# Patient Record
Sex: Male | Born: 1970 | Race: White | Hispanic: No | Smoking: Current every day smoker
Health system: Southern US, Academic
[De-identification: ages and names within clinical notes are randomized; demographics above are authoritative.]

## PROBLEM LIST (undated history)

## (undated) DIAGNOSIS — F1990 Other psychoactive substance use, unspecified, uncomplicated: Secondary | ICD-10-CM

## (undated) DIAGNOSIS — I10 Essential (primary) hypertension: Secondary | ICD-10-CM

## (undated) DIAGNOSIS — S83249A Other tear of medial meniscus, current injury, unspecified knee, initial encounter: Secondary | ICD-10-CM

## (undated) HISTORY — PX: HX TONSILLECTOMY: SHX27

## (undated) HISTORY — PX: TONSILLECTOMY: SUR1361

## (undated) HISTORY — PX: VASECTOMY: SHX75

## (undated) HISTORY — PX: KNEE ARTHROSCOPY: SUR90

---

## 1998-11-02 ENCOUNTER — Emergency Department (HOSPITAL_COMMUNITY): Admission: EM | Admit: 1998-11-02 | Discharge: 1998-11-02 | Payer: Self-pay | Admitting: Emergency Medicine

## 2009-03-16 ENCOUNTER — Encounter: Admission: RE | Admit: 2009-03-16 | Discharge: 2009-03-16 | Payer: Self-pay | Admitting: Family Medicine

## 2010-10-13 ENCOUNTER — Emergency Department (HOSPITAL_COMMUNITY): Payer: Self-pay | Admitting: Certified Registered"

## 2012-12-22 ENCOUNTER — Other Ambulatory Visit: Payer: Self-pay | Admitting: Family Medicine

## 2012-12-22 ENCOUNTER — Ambulatory Visit
Admission: RE | Admit: 2012-12-22 | Discharge: 2012-12-22 | Disposition: A | Discharge status: Home / Self Care | Payer: 59 | Source: Ambulatory Visit | Attending: Family Medicine | Admitting: Family Medicine

## 2012-12-22 DIAGNOSIS — S0083XA Contusion of other part of head, initial encounter: Secondary | ICD-10-CM

## 2013-11-09 ENCOUNTER — Ambulatory Visit: Payer: 59

## 2014-04-19 ENCOUNTER — Other Ambulatory Visit (HOSPITAL_COMMUNITY): Payer: Self-pay | Admitting: Orthopaedic Surgery

## 2014-05-04 ENCOUNTER — Other Ambulatory Visit (HOSPITAL_COMMUNITY): Payer: Self-pay | Admitting: Orthopaedic Surgery

## 2014-05-04 ENCOUNTER — Encounter (HOSPITAL_COMMUNITY): Payer: Self-pay | Admitting: Pharmacy Technician

## 2014-05-10 ENCOUNTER — Encounter (HOSPITAL_COMMUNITY): Payer: Self-pay

## 2014-05-10 ENCOUNTER — Encounter (HOSPITAL_COMMUNITY)
Admission: RE | Admit: 2014-05-10 | Discharge: 2014-05-10 | Disposition: A | Discharge status: Home / Self Care | Payer: 59 | Source: Ambulatory Visit | Attending: Orthopaedic Surgery | Admitting: Orthopaedic Surgery

## 2014-05-10 DIAGNOSIS — IMO0002 Reserved for concepts with insufficient information to code with codable children: Secondary | ICD-10-CM | POA: Diagnosis present

## 2014-05-10 DIAGNOSIS — X500XXA Overexertion from strenuous movement or load, initial encounter: Secondary | ICD-10-CM | POA: Diagnosis not present

## 2014-05-10 DIAGNOSIS — F17200 Nicotine dependence, unspecified, uncomplicated: Secondary | ICD-10-CM | POA: Diagnosis not present

## 2014-05-10 DIAGNOSIS — Z880 Allergy status to penicillin: Secondary | ICD-10-CM | POA: Diagnosis not present

## 2014-05-10 DIAGNOSIS — Y929 Unspecified place or not applicable: Secondary | ICD-10-CM | POA: Diagnosis not present

## 2014-05-10 HISTORY — DX: Other tear of medial meniscus, current injury, unspecified knee, initial encounter: S83.249A

## 2014-05-10 NOTE — Patient Instructions (Addendum)
Brendan Rodriguez  05/10/2014   Your procedure is scheduled on:  9-25 -2015 Friday  Enter through [facility] Entrance and follow signs to [facility]. Arrive at     1100   AM.  Call this number if you have problems the morning of surgery: [phone]  Or Presurgical Testing [phone].   For Living Will and/or Health Care Power Attorney Forms: please provide copy for your medical record,may bring AM of surgery(Forms should be already notarized -we do not provide this service).(05-10-14 No information preferred today)   Do not eat food/ or drink: After Midnight.  Exception: may have clear liquids:up to 6 Hours before arrival. Nothing after: 0700 AM  Clear liquids include soda, tea, black coffee, apple or grape juice, broth.  Take these medicines the morning of surgery with A SIP OF WATER: NONE.   Do not wear jewelry, make-up or nail polish.  Do not wear lotions, powders, or perfumes. You may not  wear deodorant.  Do not shave 48 hours(2 days) prior to first CHG shower(legs and under arms).(Shaving face and neck okay.)  Do not bring valuables to the hospital.(Hospital is not responsible for lost valuables).  Contacts, dentures or removable bridgework, body piercing, hair pins may not be worn into surgery.  Leave suitcase in the car. After surgery it may be brought to your room.  For patients admitted to the hospital, checkout time is 11:00 AM the day of discharge.(Restricted visitors-Any Persons displaying flu-like symptoms or illness).    Patients discharged the day of surgery will not be allowed to drive home. Must have responsible person with you x 24 hours once discharged.  Name and phone number of your driver: Brendan Rodriguez, spouse [phone] cell  Special Instructions: CHG(Chlorhedine 4%-"Hibiclens","Betasept","Aplicare") Shower Use Special Wash: see special instructions.(avoid face and genitals)   _________________________    [facility] - Preparing for  Surgery Before surgery, you can play an important role.  Because skin is not sterile, your skin needs to be as free of germs as possible.  You can reduce the number of germs on your skin by washing with CHG (chlorahexidine gluconate) soap before surgery.  CHG is an antiseptic cleaner which kills germs and bonds with the skin to continue killing germs even after washing. Please DO NOT use if you have an allergy to CHG or antibacterial soaps.  If your skin becomes reddened/irritated stop using the CHG and inform your nurse when you arrive at Short Stay. Do not shave (including legs and underarms) for at least 48 hours prior to the first CHG shower.  You may shave your face/neck. Please follow these instructions carefully:  1.  Shower with CHG Soap the night before surgery and the  morning of Surgery.  2.  If you choose to wash your hair, wash your hair first as usual with your  normal  shampoo.  3.  After you shampoo, rinse your hair and body thoroughly to remove the  shampoo.                           4.  Use CHG as you would any other liquid soap.  You can apply chg directly  to the skin and wash                       Gently with a scrungie or clean washcloth.  5.  Apply the CHG Soap to your body  ONLY FROM THE NECK DOWN.   Do not use on face/ open                           Wound or open sores. Avoid contact with eyes, ears mouth and genitals (private parts).                       Wash face,  Genitals (private parts) with your normal soap.             6.  Wash thoroughly, paying special attention to the area where your surgery  will be performed.  7.  Thoroughly rinse your body with warm water from the neck down.  8.  DO NOT shower/wash with your normal soap after using and rinsing off  the CHG Soap.                9.  Pat yourself dry with a clean towel.            10.  Wear clean pajamas.            11.  Place clean sheets on your bed the night of your first shower and do not  sleep with pets. Day  of Surgery : Do not apply any lotions/deodorants the morning of surgery.  Please wear clean clothes to the hospital/surgery center.  FAILURE TO FOLLOW THESE INSTRUCTIONS MAY RESULT IN THE CANCELLATION OF YOUR SURGERY PATIENT SIGNATURE_________________________________  NURSE SIGNATURE__________________________________  ________________________________________________________________________

## 2014-05-13 ENCOUNTER — Encounter (HOSPITAL_COMMUNITY)
Admission: RE | Disposition: A | Discharge status: Home / Self Care | Payer: Self-pay | Source: Ambulatory Visit | Attending: Orthopaedic Surgery

## 2014-05-13 ENCOUNTER — Encounter (HOSPITAL_COMMUNITY): Payer: 59 | Admitting: Certified Registered Nurse Anesthetist

## 2014-05-13 ENCOUNTER — Ambulatory Visit (HOSPITAL_COMMUNITY)
Admission: RE | Admit: 2014-05-13 | Discharge: 2014-05-13 | Disposition: A | Discharge status: Home / Self Care | Payer: 59 | Source: Ambulatory Visit | Attending: Orthopaedic Surgery | Admitting: Orthopaedic Surgery

## 2014-05-13 ENCOUNTER — Ambulatory Visit (HOSPITAL_COMMUNITY): Payer: 59 | Admitting: Certified Registered Nurse Anesthetist

## 2014-05-13 ENCOUNTER — Encounter (HOSPITAL_COMMUNITY): Payer: Self-pay

## 2014-05-13 DIAGNOSIS — IMO0002 Reserved for concepts with insufficient information to code with codable children: Secondary | ICD-10-CM | POA: Diagnosis not present

## 2014-05-13 DIAGNOSIS — X500XXA Overexertion from strenuous movement or load, initial encounter: Secondary | ICD-10-CM | POA: Insufficient documentation

## 2014-05-13 DIAGNOSIS — S83207A Unspecified tear of unspecified meniscus, current injury, left knee, initial encounter: Secondary | ICD-10-CM

## 2014-05-13 DIAGNOSIS — Y929 Unspecified place or not applicable: Secondary | ICD-10-CM | POA: Insufficient documentation

## 2014-05-13 DIAGNOSIS — F17200 Nicotine dependence, unspecified, uncomplicated: Secondary | ICD-10-CM | POA: Insufficient documentation

## 2014-05-13 DIAGNOSIS — Z880 Allergy status to penicillin: Secondary | ICD-10-CM | POA: Insufficient documentation

## 2014-05-13 HISTORY — PX: KNEE ARTHROSCOPY: SHX127

## 2014-05-13 SURGERY — ARTHROSCOPY, KNEE
Anesthesia: General | Site: Knee | Laterality: Left

## 2014-05-13 MED ORDER — CLINDAMYCIN PHOSPHATE 900 MG/50ML IV SOLN
900.0000 mg | INTRAVENOUS | Status: AC
  Administered 2014-05-13: 900 mg via INTRAVENOUS

## 2014-05-13 MED ORDER — HYDROMORPHONE HCL 1 MG/ML IJ SOLN
INTRAMUSCULAR | Status: DC | PRN
  Administered 2014-05-13: 0.5 mg via INTRAVENOUS
  Administered 2014-05-13: 1 mg via INTRAVENOUS
  Administered 2014-05-13: 0.5 mg via INTRAVENOUS

## 2014-05-13 MED ORDER — MORPHINE SULFATE 4 MG/ML IJ SOLN
INTRAMUSCULAR | Status: DC | PRN
  Administered 2014-05-13: 4 mg via INTRAVENOUS

## 2014-05-13 MED ORDER — ONDANSETRON HCL 4 MG/2ML IJ SOLN
INTRAMUSCULAR | Status: AC
  Filled 2014-05-13: qty 2

## 2014-05-13 MED ORDER — LIDOCAINE HCL (CARDIAC) 20 MG/ML IV SOLN
INTRAVENOUS | Status: AC
  Filled 2014-05-13: qty 5

## 2014-05-13 MED ORDER — FENTANYL CITRATE 0.05 MG/ML IJ SOLN
INTRAMUSCULAR | Status: AC
  Filled 2014-05-13: qty 2

## 2014-05-13 MED ORDER — SODIUM CHLORIDE 0.9 % IV SOLN
INTRAVENOUS | Status: DC | PRN
  Administered 2014-05-13: 14:00:00 via INTRAVENOUS

## 2014-05-13 MED ORDER — HYDROMORPHONE HCL 2 MG/ML IJ SOLN
INTRAMUSCULAR | Status: AC
  Filled 2014-05-13: qty 1

## 2014-05-13 MED ORDER — BUPIVACAINE HCL (PF) 0.5 % IJ SOLN
INTRAMUSCULAR | Status: AC
  Filled 2014-05-13: qty 30

## 2014-05-13 MED ORDER — ONDANSETRON HCL 4 MG/2ML IJ SOLN
INTRAMUSCULAR | Status: DC | PRN
  Administered 2014-05-13: 4 mg via INTRAVENOUS

## 2014-05-13 MED ORDER — MIDAZOLAM HCL 5 MG/5ML IJ SOLN
INTRAMUSCULAR | Status: DC | PRN
  Administered 2014-05-13: 2 mg via INTRAVENOUS

## 2014-05-13 MED ORDER — MORPHINE SULFATE 4 MG/ML IJ SOLN
INTRAMUSCULAR | Status: AC
  Filled 2014-05-13: qty 1

## 2014-05-13 MED ORDER — MEPERIDINE HCL 50 MG/ML IJ SOLN: 6.2500 mg | INTRAMUSCULAR | Status: DC | PRN

## 2014-05-13 MED ORDER — BUPIVACAINE HCL (PF) 0.5 % IJ SOLN
INTRAMUSCULAR | Status: DC | PRN
  Administered 2014-05-13: 30 mL

## 2014-05-13 MED ORDER — MIDAZOLAM HCL 2 MG/2ML IJ SOLN
INTRAMUSCULAR | Status: AC
  Filled 2014-05-13: qty 2

## 2014-05-13 MED ORDER — FENTANYL CITRATE 0.05 MG/ML IJ SOLN
INTRAMUSCULAR | Status: DC | PRN
  Administered 2014-05-13: 100 ug via INTRAVENOUS

## 2014-05-13 MED ORDER — LACTATED RINGERS IR SOLN
Status: DC | PRN
  Administered 2014-05-13: 2

## 2014-05-13 MED ORDER — FENTANYL CITRATE 0.05 MG/ML IJ SOLN: 25.0000 ug | INTRAMUSCULAR | Status: DC | PRN

## 2014-05-13 MED ORDER — HYDROCODONE-ACETAMINOPHEN 5-325 MG PO TABS: 1.0000 | ORAL_TABLET | ORAL | Status: AC | PRN

## 2014-05-13 MED ORDER — PROMETHAZINE HCL 25 MG/ML IJ SOLN: 6.2500 mg | INTRAMUSCULAR | Status: DC | PRN

## 2014-05-13 MED ORDER — HYDROCODONE-ACETAMINOPHEN 5-325 MG PO TABS
1.0000 | ORAL_TABLET | Freq: Once | ORAL | Status: AC
  Administered 2014-05-13: 2 via ORAL
  Filled 2014-05-13: qty 2

## 2014-05-13 MED ORDER — LACTATED RINGERS IV SOLN
INTRAVENOUS | Status: DC
  Administered 2014-05-13: 16:00:00 via INTRAVENOUS

## 2014-05-13 MED ORDER — LIDOCAINE HCL (CARDIAC) 20 MG/ML IV SOLN
INTRAVENOUS | Status: DC | PRN
  Administered 2014-05-13: 50 mg via INTRAVENOUS

## 2014-05-13 MED ORDER — PROPOFOL 10 MG/ML IV BOLUS
INTRAVENOUS | Status: DC | PRN
  Administered 2014-05-13: 180 mg via INTRAVENOUS

## 2014-05-13 MED ORDER — CLINDAMYCIN PHOSPHATE 900 MG/50ML IV SOLN
INTRAVENOUS | Status: AC
  Filled 2014-05-13: qty 50

## 2014-05-13 MED ORDER — LACTATED RINGERS IV SOLN
INTRAVENOUS | Status: DC
  Administered 2014-05-13: 1000 mL via INTRAVENOUS

## 2014-05-13 MED ORDER — PROPOFOL 10 MG/ML IV BOLUS
INTRAVENOUS | Status: AC
  Filled 2014-05-13: qty 20

## 2014-05-13 MED ORDER — SODIUM CHLORIDE 0.9 % IV SOLN: INTRAVENOUS | Status: DC

## 2014-05-13 SURGICAL SUPPLY — 28 items
BLADE CUDA SHAVER 3.5 (BLADE) ×2 IMPLANT
COUNTER NEEDLE 20 DBL MAG RED (NEEDLE) ×2 IMPLANT
CUFF TOURN SGL QUICK 34 (TOURNIQUET CUFF) ×2
CUFF TRNQT CYL 34X4X40X1 (TOURNIQUET CUFF) ×1 IMPLANT
DRAPE U-SHAPE 47X51 STRL (DRAPES) ×2 IMPLANT
DRSG EMULSION OIL 3X3 NADH (GAUZE/BANDAGES/DRESSINGS) ×1 IMPLANT
DURAPREP 26ML APPLICATOR (WOUND CARE) ×2 IMPLANT
GAUZE XEROFORM 1X8 LF (GAUZE/BANDAGES/DRESSINGS) ×2 IMPLANT
GLOVE BIO SURGEON STRL SZ7.5 (GLOVE) ×2 IMPLANT
GLOVE BIOGEL PI IND STRL 8 (GLOVE) ×1 IMPLANT
GLOVE BIOGEL PI INDICATOR 8 (GLOVE) ×1
GOWN STRL REUS W/TWL XL LVL3 (GOWN DISPOSABLE) ×2 IMPLANT
IV LACTATED RINGER IRRG 3000ML (IV SOLUTION) ×4
IV LR IRRIG 3000ML ARTHROMATIC (IV SOLUTION) ×2 IMPLANT
KIT BASIN OR (CUSTOM PROCEDURE TRAY) ×2 IMPLANT
MANIFOLD NEPTUNE II (INSTRUMENTS) ×2 IMPLANT
PACK ARTHROSCOPY WL (CUSTOM PROCEDURE TRAY) ×2 IMPLANT
PAD ABD 8X10 STRL (GAUZE/BANDAGES/DRESSINGS) ×1 IMPLANT
PADDING CAST COTTON 6X4 STRL (CAST SUPPLIES) ×2 IMPLANT
SET ARTHROSCOPY TUBING (MISCELLANEOUS) ×2
SET ARTHROSCOPY TUBING LN (MISCELLANEOUS) ×1 IMPLANT
SUT ETHILON 4 0 PS 2 18 (SUTURE) ×2 IMPLANT
SYR 20CC LL (SYRINGE) ×2 IMPLANT
SYR CONTROL 10ML LL (SYRINGE) ×2 IMPLANT
TOWEL OR 17X26 10 PK STRL BLUE (TOWEL DISPOSABLE) ×2 IMPLANT
TUBING CONNECTING 10 (TUBING) IMPLANT
WAND 90 DEG TURBOVAC W/CORD (SURGICAL WAND) IMPLANT
WRAP KNEE MAXI GEL POST OP (GAUZE/BANDAGES/DRESSINGS) ×2 IMPLANT

## 2014-05-13 NOTE — Brief Op Note (Signed)
05/13/2014  2:39 PM  PATIENT:  Brendan Rodriguez  43 y.o. male  PRE-OPERATIVE DIAGNOSIS:  Left knee medial meniscal tear  POST-OPERATIVE DIAGNOSIS:  Left knee medial meniscal tear  PROCEDURE:  Procedure(s): Left knee arthroscopy with partial medial meniscectomy (Left)  SURGEON:  Surgeon(s) and Role:    * Kathryne Hitch, MD - Primary  PHYSICIAN ASSISTANT: Rexene Edison, PA-C  ANESTHESIA:   local and general  EBL:  Total I/O In: 1000 [I.V.:1000] Out: -   BLOOD ADMINISTERED:none  DRAINS: none   LOCAL MEDICATIONS USED:  MARCAINE     SPECIMEN:  No Specimen  DISPOSITION OF SPECIMEN:  N/A  COUNTS:  YES  TOURNIQUET:  * No tourniquets in log *  DICTATION: .Other Dictation: Dictation Number [id number]  PLAN OF CARE: Discharge to home after PACU  PATIENT DISPOSITION:  PACU - hemodynamically stable.   Delay start of Pharmacological VTE agent (>24hrs) due to surgical blood loss or risk of bleeding: not applicable

## 2014-05-13 NOTE — Anesthesia Preprocedure Evaluation (Addendum)
Anesthesia Evaluation  Patient identified by MRN, date of birth, ID band Patient awake    Reviewed: Allergy & Precautions, H&P , NPO status , Patient's Chart, lab work & pertinent test results  Airway Mallampati: II TM Distance: >3 FB Neck ROM: Full    Dental no notable dental hx.    Pulmonary neg pulmonary ROS, Current Smoker,  breath sounds clear to auscultation  Pulmonary exam normal       Cardiovascular negative cardio ROS  Rhythm:Regular Rate:Normal     Neuro/Psych negative neurological ROS  negative psych ROS   GI/Hepatic negative GI ROS, Neg liver ROS,   Endo/Other  negative endocrine ROS  Renal/GU negative Renal ROS  negative genitourinary   Musculoskeletal negative musculoskeletal ROS (+)   Abdominal   Peds negative pediatric ROS (+)  Hematology negative hematology ROS (+)   Anesthesia Other Findings   Reproductive/Obstetrics negative OB ROS                           Anesthesia Physical Anesthesia Plan  ASA: II  Anesthesia Plan: General   Post-op Pain Management:    Induction: Intravenous  Airway Management Planned: LMA  Additional Equipment:   Intra-op Plan:   Post-operative Plan: Extubation in OR  Informed Consent: I have reviewed the patients History and Physical, chart, labs and discussed the procedure including the risks, benefits and alternatives for the proposed anesthesia with the patient or authorized representative who has indicated his/her understanding and acceptance.   Dental advisory given  Plan Discussed with: CRNA  Anesthesia Plan Comments:         Anesthesia Quick Evaluation  

## 2014-05-13 NOTE — Anesthesia Postprocedure Evaluation (Signed)
  Anesthesia Post-op Note  Patient: Brendan Rodriguez  Procedure(s) Performed: Procedure(s) (LRB): Left knee arthroscopy with partial medial meniscectomy (Left)  Patient Location: PACU  Anesthesia Type: General  Level of Consciousness: awake and alert   Airway and Oxygen Therapy: Patient Spontanous Breathing  Post-op Pain: mild  Post-op Assessment: Post-op Vital signs reviewed, Patient's Cardiovascular Status Stable, Respiratory Function Stable, Patent Airway and No signs of Nausea or vomiting  Last Vitals:  Filed Vitals:   05/13/14 1500  BP: 134/77  Pulse: 60  Temp:   Resp: 17    Post-op Vital Signs: stable   Complications: No apparent anesthesia complications

## 2014-05-13 NOTE — H&P (Signed)
Brendan Rodriguez is an 43 y.o. male.   Chief Complaint: left knee pain with locking and catching HPI: 43 yo male with an acute injury to his left knee.  With locking, catching and swelling, a MRI was obtained that showed a large horizontal bucket handle tear of the medial meniscus.  Surgery has been recommended due to his mechanical symptoms.  He understands fully the risks and benefits of surgery.  Past Medical History  Diagnosis Date  . Torn medial meniscus     left knee    Past Surgical History  Procedure Laterality Date  . Knee arthroscopy Right     '95 torned menicus  . Tonsillectomy    . Vasectomy      No family history on file. Social History:  reports that he has been smoking.  He does not have any smokeless tobacco history on file. He reports that he drinks alcohol. He reports that he does not use illicit drugs.  Allergies:  Allergies  Allergen Reactions  . Penicillins     Light headed. Fainted.     No prescriptions prior to admission    No results found for this or any previous visit (from the past 48 hour(s)). No results found.  Review of Systems  All other systems reviewed and are negative.   There were no vitals taken for this visit. Physical Exam  Constitutional: He is oriented to person, place, and time. He appears well-developed and well-nourished.  HENT:  Head: Normocephalic and atraumatic.  Eyes: EOM are normal. Pupils are equal, round, and reactive to light.  Neck: Normal range of motion. Neck supple.  Cardiovascular: Normal rate and regular rhythm.   Respiratory: Effort normal and breath sounds normal.  GI: Soft. Bowel sounds are normal.  Musculoskeletal:       Left knee: He exhibits effusion and abnormal meniscus. Tenderness found. Lateral joint line tenderness noted.  Neurological: He is alert and oriented to person, place, and time.  Skin: Skin is warm and dry.  Psychiatric: He has a normal mood and affect.     Assessment/Plan Left knee  acute meniscal tear 1)  To the OR today for a left knee arthroscopy with a partial meniscectomy.  BLACKMAN,CHRISTOPHER Y 05/13/2014, 7:30 AM

## 2014-05-13 NOTE — Transfer of Care (Signed)
Immediate Anesthesia Transfer of Care Note  Patient: Brendan Rodriguez  Procedure(s) Performed: Procedure(s) (LRB): Left knee arthroscopy with partial medial meniscectomy (Left)  Patient Location: PACU  Anesthesia Type: General  Level of Consciousness: sedated, patient cooperative and responds to stimulation  Airway & Oxygen Therapy: Patient Spontanous Breathing and Patient connected to face mask oxgen  Post-op Assessment: Report given to PACU RN and Post -op Vital signs reviewed and stable  Post vital signs: Reviewed and stable  Complications: No apparent anesthesia complications

## 2014-05-13 NOTE — Discharge Instructions (Signed)
Increase your activities as comfort allows. Ice and elevation as needed. You may put full weight on your leg. You can remove all of your dressings tomorrow and start getting your incisions wet daily in the shower. Place band-aids daily over your incisions to protect the sutures. Do take a full-strength 325 mg aspirin daily for the next 7 days.

## 2014-05-14 NOTE — Op Note (Signed)
Brendan Rodriguez, Brendan Rodriguez NO.:  [id number]  MEDICAL RECORD NO.:  [id number]  LOCATION:  [facility]                         FACILITY:  [facility]  PHYSICIAN:  Vanita Panda. Magnus Ivan, M.D.DATE OF BIRTH:  [birth year]  DATE OF PROCEDURE:  05/13/2014 DATE OF DISCHARGE:  05/13/2014                              OPERATIVE REPORT   PREOPERATIVE DIAGNOSIS:  Left knee with acute large buckle-handle tear of the medial meniscus.  POSTOPERATIVE DIAGNOSIS:  Left knee with acute large buckle-handle tear of the medial meniscus.  FINDINGS:  Acute large bucket-handle medial meniscal tear, left knee.  PROCEDURE:  Left knee arthroscopy with partial medial meniscectomy.  SURGEON:  Vanita Panda. Magnus Ivan, M.D.  ASSISTANT:  Richardean Canal, PA-C.  ANESTHESIA: 1. General. 2. Local.  BLOOD LOSS:  Minimal.  COMPLICATIONS:  None.  INDICATIONS:  Brendan Rodriguez is a 43 year old gentleman who in early August did some type of minimal activity with landing on his knee awkwardly and felt a pop in his knee.  He had a medial locking, catching and inability to straighten his knee.  An MRI was eventually obtained, which showed a large bucket-handle tear of the medial meniscus.  We talked to him about his options including a partial medial meniscectomy versus a meniscal repair.  He understands risks of both of these as well as benefits, and he did wish to proceed with a partial medial meniscectomy.  The risks and benefits of surgery were explained to him again in great detail and he did wish to proceed.  PROCEDURE DESCRIPTION:  After informed consent was obtained, appropriate left knee was marked.  He was brought to the operating room, placed supine on the operating room table.  General anesthesia was then obtained.  His left leg was prepped and draped from the thigh down the ankle with DuraPrep and sterile drapes including sterile stockinette with a bed raised and a lateral leg was utilized.  A time-out was  called to identify correct patient, correct left knee.  I then made an anterolateral arthroscopy portal.  I inserted a cannula into the knee and found a large effusion.  We went to the medial compartment and made an anteromedial incision and right away found a large bucket-handle medial meniscal tear.  This went from the mid body all the way back to the posterior horn.  Using up cutting biters and arthroscopic shaver, we removed a large bucket-handle tear.  We then probed the remaining parts of the meniscus and found them to be intact with still some meniscal rim posteriorly intact.  We then prepped the ACL and PCL and found those to be intact or the lateral meniscus looked good, as well as the cartilage throughout the knee.  The patellofemoral joint was also normal.  We then allowed fluid lavage of the knee and drained off fluid from the knee and tried to remove as much remnant debris as we could.  We then closed the portal sites with interrupted nylon suture __________ a mixture of morphine and Marcaine into the knee.  A well-padded sterile dressing was applied.  He was awakened and extubated and taken to recovery room in stable condition.  All final counts were correct.  There were no complications noted.     Vanita Panda. Magnus Ivan, M.D.     CYB/MEDQ  D:  05/13/2014  T:  05/14/2014  Job:  [id number]

## 2014-05-16 ENCOUNTER — Encounter (HOSPITAL_COMMUNITY): Payer: Self-pay | Admitting: Orthopaedic Surgery

## 2016-08-08 ENCOUNTER — Ambulatory Visit (INDEPENDENT_AMBULATORY_CARE_PROVIDER_SITE_OTHER): Payer: Self-pay | Admitting: Physician Assistant

## 2016-08-08 DIAGNOSIS — M7021 Olecranon bursitis, right elbow: Secondary | ICD-10-CM

## 2016-08-08 NOTE — Progress Notes (Signed)
   Office Visit Note   Patient: Brendan Rodriguez           Date of Birth: 1971/06/14           MRN: [id number] Visit Date: 08/08/2016              Requested by: No referring provider defined for this encounter. PCP: No primary care provider on file.   Assessment & Plan: Visit Diagnoses:  1. Olecranon bursitis, right elbow     Plan: Compressive bandage is applied and leave this on until tonight. Advised him to not rest his elbow on anything Morrie Sheldonshley whenever he is to sitting around like his desk at work  Follow-Up Instructions: Return if symptoms worsen or fail to improve.   Orders:  Orders Placed This Encounter  Procedures  . Hand/Upper Extremity Injection/Arthrocentesis   No orders of the defined types were placed in this encounter.     Procedures: Hand/UE Inj Date/Time: 08/08/2016 3:41 PM Performed by: Kirtland BouchardLARK, Emmelia Holdsworth W Authorized by: Kirtland BouchardLARK, Rozann Holts W   Consent Given by:  Patient Indications:  Pain Condition comment:  Olecranon bursa Needle Size:  22 G Approach:  Dorsal Aspirate amount (mL):  10 Aspirate:  Blood-tinged Patient tolerance:  Patient tolerated the procedure well with no immediate complications     Clinical Data: No additional findings.   Subjective: Chief Complaint  Patient presents with  . Right Elbow - Pain    Swelling on elbow x a few weeks.    Brendan Rodriguez is well known to Dr. Eliberto IvoryBlackman's service comes in with a new complaint of right elbow swelling and pain. No known injury area. States that he rest his elbow on his desk throughout the day. Elbow swelling has been there for about a week and a half no fevers or chills    Review of Systems   Objective: Vital Signs: There were no vitals taken for this visit.  Physical Exam  Constitutional: He is oriented to person, place, and time. He appears well-developed and well-nourished. No distress.  Neurological: He is alert and oriented to person, place, and time.  Psychiatric: He has a normal  mood and affect.    Ortho Exam Right elbow: Obvious olecranon bursitis is no abnormal warmth the swelling and slight erythema. Overall good range of motion of the elbow without pain. Hand sensation intact. Right radial pulses intact. Specialty Comments:  No specialty comments available.  Imaging: No results found.   PMFS History: Patient Active Problem List   Diagnosis Date Noted  . Acute meniscal tear of left knee 05/13/2014   Past Medical History:  Diagnosis Date  . Torn medial meniscus    left knee    No family history on file.  Past Surgical History:  Procedure Laterality Date  . KNEE ARTHROSCOPY Right    '95 torned menicus  . KNEE ARTHROSCOPY Left 05/13/2014   Procedure: Left knee arthroscopy with partial medial meniscectomy;  Surgeon: Kathryne Hitchhristopher Y Blackman, MD;  Location: [facility];  Service: Orthopedics;  Laterality: Left;  . TONSILLECTOMY    . VASECTOMY     Social History   Occupational History  . Not on file.   Social History Main Topics  . Smoking status: Current Every Day Smoker    Packs/day: 1.00    Years: 20.00  . Smokeless tobacco: Not on file  . Alcohol use Yes     Comment: weekends  . Drug use: No  . Sexual activity: Yes

## 2016-09-19 ENCOUNTER — Ambulatory Visit (INDEPENDENT_AMBULATORY_CARE_PROVIDER_SITE_OTHER): Payer: Commercial Managed Care - HMO | Admitting: Physician Assistant

## 2016-09-19 DIAGNOSIS — M7021 Olecranon bursitis, right elbow: Secondary | ICD-10-CM

## 2016-09-19 MED ORDER — METHYLPREDNISOLONE ACETATE 40 MG/ML IJ SUSP
40.0000 mg | INTRAMUSCULAR | Status: AC | PRN
  Administered 2016-09-19: 40 mg

## 2016-09-19 MED ORDER — LIDOCAINE HCL 1 % IJ SOLN
3.0000 mL | INTRAMUSCULAR | Status: AC | PRN
  Administered 2016-09-19: 3 mL

## 2016-09-19 NOTE — Progress Notes (Signed)
   Office Visit Note   Patient: Brendan Rodriguez           Date of Birth: [birth year]           MRN: [id number] Visit Date: 09/19/2016              Requested by: No referring provider defined for this encounter. PCP: Johny BlamerHARRIS, WILLIAM, MD   Assessment & Plan: Visit Diagnoses:  1. Olecranon bursitis, right elbow     Plan: The elbow was wrapped with a compression dressing he'll leave this on until this evening and remove it. Did discuss with him if this reoccurs would recommend right elbow olecranon bursectomy. He'll follow with us on a when necessary basis.  Follow-Up Instructions: Return if symptoms worsen or fail to improve.   Orders:  Orders Placed This Encounter  Procedures  . Hand/Upper Extremity Injection/Arthrocentesis  . Hand/Upper Extremity Injection/Arthrocentesis   No orders of the defined types were placed in this encounter.     Procedures: Hand/UE Inj Date/Time: 09/19/2016 5:09 PM Performed by: Kirtland BouchardLARK, GILBERT W Authorized by: Kirtland BouchardLARK, GILBERT W   Consent Given by:  Patient Indications:  Pain Condition comment:  Olecranon bursitis Approach:  Dorsal Ultrasound Guidance: No   Medications:  3 mL lidocaine 1 %; 40 mg methylPREDNISolone acetate 40 MG/ML Aspirate amount (mL):  15     Clinical Data: No additional findings.   Subjective: No chief complaint on file.   HPI Brendan Rodriguez returns today for his right elbow swelling again. He is questioning another injection and aspiration. He discussed does state that he came down hard on the elbow recently and that he feels that this made it actually little bit smaller. He's having pain resting his weight on his elbow. Review of Systems   Objective: Vital Signs: There were no vitals taken for this visit.  Physical Exam  Constitutional: He appears well-developed and well-nourished. No distress.    Ortho Exam Elbow has olecranon bursitis. Minimal erythema no abnormal warmth no purulent drainage. Good range of motion of  the right elbow without significant pain Specialty Comments:  No specialty comments available.  Imaging: No results found.   PMFS History: Patient Active Problem List   Diagnosis Date Noted  . Acute meniscal tear of left knee 05/13/2014   Past Medical History:  Diagnosis Date  . Torn medial meniscus    left knee    No family history on file.  Past Surgical History:  Procedure Laterality Date  . KNEE ARTHROSCOPY Right    '95 torned menicus  . KNEE ARTHROSCOPY Left 05/13/2014   Procedure: Left knee arthroscopy with partial medial meniscectomy;  Surgeon: Kathryne Hitchhristopher Y Blackman, MD;  Location: [facility];  Service: Orthopedics;  Laterality: Left;  . TONSILLECTOMY    . VASECTOMY     Social History   Occupational History  . Not on file.   Social History Main Topics  . Smoking status: Current Every Day Smoker    Packs/day: 1.00    Years: 20.00  . Smokeless tobacco: Not on file  . Alcohol use Yes     Comment: weekends  . Drug use: No  . Sexual activity: Yes

## 2016-11-04 ENCOUNTER — Other Ambulatory Visit: Payer: Self-pay | Admitting: Family Medicine

## 2016-11-04 ENCOUNTER — Ambulatory Visit
Admission: RE | Admit: 2016-11-04 | Discharge: 2016-11-04 | Disposition: A | Discharge status: Home / Self Care | Payer: Commercial Managed Care - HMO | Source: Ambulatory Visit | Attending: Family Medicine | Admitting: Family Medicine

## 2016-11-04 DIAGNOSIS — M542 Cervicalgia: Secondary | ICD-10-CM

## 2017-01-23 DIAGNOSIS — M5412 Radiculopathy, cervical region: Secondary | ICD-10-CM | POA: Diagnosis not present

## 2017-01-23 DIAGNOSIS — M542 Cervicalgia: Secondary | ICD-10-CM | POA: Diagnosis not present

## 2017-01-27 DIAGNOSIS — M5412 Radiculopathy, cervical region: Secondary | ICD-10-CM | POA: Diagnosis not present

## 2017-01-27 DIAGNOSIS — M542 Cervicalgia: Secondary | ICD-10-CM | POA: Diagnosis not present

## 2017-01-31 DIAGNOSIS — M5412 Radiculopathy, cervical region: Secondary | ICD-10-CM | POA: Diagnosis not present

## 2017-01-31 DIAGNOSIS — M542 Cervicalgia: Secondary | ICD-10-CM | POA: Diagnosis not present

## 2017-02-03 DIAGNOSIS — M542 Cervicalgia: Secondary | ICD-10-CM | POA: Diagnosis not present

## 2017-02-03 DIAGNOSIS — M5412 Radiculopathy, cervical region: Secondary | ICD-10-CM | POA: Diagnosis not present

## 2017-02-06 DIAGNOSIS — M5412 Radiculopathy, cervical region: Secondary | ICD-10-CM | POA: Diagnosis not present

## 2017-02-06 DIAGNOSIS — M542 Cervicalgia: Secondary | ICD-10-CM | POA: Diagnosis not present

## 2017-02-11 DIAGNOSIS — M542 Cervicalgia: Secondary | ICD-10-CM | POA: Diagnosis not present

## 2017-02-11 DIAGNOSIS — M5412 Radiculopathy, cervical region: Secondary | ICD-10-CM | POA: Diagnosis not present

## 2017-02-13 DIAGNOSIS — M5412 Radiculopathy, cervical region: Secondary | ICD-10-CM | POA: Diagnosis not present

## 2017-02-13 DIAGNOSIS — M542 Cervicalgia: Secondary | ICD-10-CM | POA: Diagnosis not present

## 2017-02-25 DIAGNOSIS — M542 Cervicalgia: Secondary | ICD-10-CM | POA: Diagnosis not present

## 2017-02-25 DIAGNOSIS — M5412 Radiculopathy, cervical region: Secondary | ICD-10-CM | POA: Diagnosis not present

## 2017-06-01 ENCOUNTER — Observation Stay
Admission: EM | Admit: 2017-06-01 | Discharge: 2017-06-03 | Payer: 59 | Source: Other Acute Inpatient Hospital | Attending: Surgery | Admitting: Surgery

## 2017-06-01 ENCOUNTER — Encounter (HOSPITAL_COMMUNITY): Payer: Self-pay

## 2017-06-01 ENCOUNTER — Emergency Department (HOSPITAL_COMMUNITY): Payer: 59

## 2017-06-01 ENCOUNTER — Emergency Department
Admission: EM | Admit: 2017-06-01 | Discharge: 2017-06-01 | Disposition: A | Discharge status: Other Acute Inpatient Hospital | Payer: 59 | Source: Ambulatory Visit | Attending: Emergency Medicine | Admitting: Emergency Medicine

## 2017-06-01 ENCOUNTER — Observation Stay (HOSPITAL_BASED_OUTPATIENT_CLINIC_OR_DEPARTMENT_OTHER): Payer: 59 | Admitting: Surgery

## 2017-06-01 DIAGNOSIS — S0280XA Fracture of other specified skull and facial bones, unspecified side, initial encounter for closed fracture: Secondary | ICD-10-CM

## 2017-06-01 DIAGNOSIS — H5712 Ocular pain, left eye: Secondary | ICD-10-CM | POA: Insufficient documentation

## 2017-06-01 DIAGNOSIS — S01119A Laceration without foreign body of unspecified eyelid and periocular area, initial encounter: Secondary | ICD-10-CM

## 2017-06-01 DIAGNOSIS — F17210 Nicotine dependence, cigarettes, uncomplicated: Secondary | ICD-10-CM | POA: Insufficient documentation

## 2017-06-01 DIAGNOSIS — S0181XA Laceration without foreign body of other part of head, initial encounter: Secondary | ICD-10-CM | POA: Diagnosis present

## 2017-06-01 DIAGNOSIS — S058X2A Other injuries of left eye and orbit, initial encounter: Secondary | ICD-10-CM

## 2017-06-01 DIAGNOSIS — Z23 Encounter for immunization: Secondary | ICD-10-CM | POA: Insufficient documentation

## 2017-06-01 DIAGNOSIS — S0232XA Fracture of orbital floor, left side, initial encounter for closed fracture: Secondary | ICD-10-CM | POA: Insufficient documentation

## 2017-06-01 DIAGNOSIS — H539 Unspecified visual disturbance: Secondary | ICD-10-CM | POA: Insufficient documentation

## 2017-06-01 DIAGNOSIS — S01112A Laceration without foreign body of left eyelid and periocular area, initial encounter: Secondary | ICD-10-CM | POA: Insufficient documentation

## 2017-06-01 DIAGNOSIS — S0282XA Fracture of other specified skull and facial bones, left side, initial encounter for closed fracture: Secondary | ICD-10-CM

## 2017-06-01 DIAGNOSIS — S0512XA Contusion of eyeball and orbital tissues, left eye, initial encounter: Secondary | ICD-10-CM

## 2017-06-01 DIAGNOSIS — S022XXA Fracture of nasal bones, initial encounter for closed fracture: Principal | ICD-10-CM | POA: Insufficient documentation

## 2017-06-01 DIAGNOSIS — S0285XA Fracture of orbit, unspecified, initial encounter for closed fracture: Secondary | ICD-10-CM

## 2017-06-01 DIAGNOSIS — S0292XA Unspecified fracture of facial bones, initial encounter for closed fracture: Secondary | ICD-10-CM | POA: Diagnosis present

## 2017-06-01 HISTORY — DX: Essential (primary) hypertension: I10

## 2017-06-01 HISTORY — DX: Other psychoactive substance use, unspecified, uncomplicated: F19.90

## 2017-06-01 MED ORDER — AMOXICILLIN 875 MG-POTASSIUM CLAVULANATE 125 MG TABLET
1.0000 | ORAL_TABLET | Freq: Two times a day (BID) | ORAL | Status: DC
Start: 2017-06-02 — End: 2017-06-03
  Administered 2017-06-02 – 2017-06-03 (×3): 1 via ORAL
  Filled 2017-06-01 (×4): qty 1

## 2017-06-01 MED ORDER — ACETAMINOPHEN 325 MG TABLET
650.0000 mg | ORAL_TABLET | Freq: Four times a day (QID) | ORAL | Status: DC | PRN
Start: 2017-06-01 — End: 2017-06-03

## 2017-06-01 MED ORDER — AMOXICILLIN 875 MG-POTASSIUM CLAVULANATE 125 MG TABLET
1.00 | ORAL_TABLET | ORAL | Status: AC
Start: 2017-06-01 — End: 2017-06-01
  Administered 2017-06-01 (×2): 1 via ORAL
  Filled 2017-06-01: qty 1

## 2017-06-01 MED ORDER — HYDROCODONE 5 MG-ACETAMINOPHEN 325 MG TABLET
1.0000 | ORAL_TABLET | ORAL | Status: DC | PRN
Start: 2017-06-01 — End: 2017-06-03

## 2017-06-01 MED ORDER — FLUORESCEIN 1 MG EYE STRIPS
1.00 | ORAL_STRIP | OPHTHALMIC | Status: AC
Start: 2017-06-01 — End: 2017-06-01
  Administered 2017-06-01: 1 via OPHTHALMIC
  Filled 2017-06-01: qty 1

## 2017-06-01 MED ORDER — ONDANSETRON HCL (PF) 4 MG/2 ML INJECTION SOLUTION
4.00 mg | INTRAMUSCULAR | Status: AC
Start: 2017-06-01 — End: 2017-06-01
  Administered 2017-06-01: 4 mg via INTRAVENOUS
  Filled 2017-06-01: qty 2

## 2017-06-01 MED ORDER — MORPHINE 4 MG/ML INTRAVENOUS CARTRIDGE
4.00 mg | CARTRIDGE | INTRAVENOUS | Status: AC
Start: 2017-06-01 — End: 2017-06-01
  Administered 2017-06-01: 4 mg via INTRAVENOUS
  Filled 2017-06-01: qty 1

## 2017-06-01 MED ORDER — SODIUM CHLORIDE 0.9 % (FLUSH) INJECTION SYRINGE
2.0000 mL | INJECTION | Freq: Three times a day (TID) | INTRAMUSCULAR | Status: DC
Start: 2017-06-01 — End: 2017-06-03
  Administered 2017-06-01 – 2017-06-02 (×2): 2 mL
  Administered 2017-06-02 – 2017-06-03 (×3): 0 mL

## 2017-06-01 MED ORDER — MAGNESIUM HYDROXIDE 400 MG/5 ML ORAL SUSPENSION
15.0000 mL | ORAL | Status: DC
Start: 2017-06-02 — End: 2017-06-03
  Administered 2017-06-02: 0 mg via ORAL

## 2017-06-01 MED ORDER — HYDROCODONE 5 MG-ACETAMINOPHEN 325 MG TABLET
2.0000 | ORAL_TABLET | ORAL | Status: DC | PRN
Start: 2017-06-01 — End: 2017-06-03
  Administered 2017-06-02 (×3): 2 via ORAL
  Filled 2017-06-01 (×3): qty 2

## 2017-06-01 MED ORDER — DIPHTH,PERTUSSIS(ACELL),TETANUS 2.5 LF UNIT-8 MCG-5 LF/0.5 ML IM SUSP
0.5000 mL | Freq: Once | INTRAMUSCULAR | Status: AC
Start: 2017-06-01 — End: 2017-06-01
  Administered 2017-06-01: 0.5 mL via INTRAMUSCULAR
  Filled 2017-06-01: qty 0.5

## 2017-06-01 MED ORDER — PROPARACAINE 0.5 % EYE DROPS
1.00 [drp] | OPHTHALMIC | Status: AC
Start: 2017-06-01 — End: 2017-06-01
  Administered 2017-06-01: 1 [drp] via OPHTHALMIC
  Filled 2017-06-01: qty 15

## 2017-06-01 MED ORDER — SODIUM CHLORIDE 0.9 % (FLUSH) INJECTION SYRINGE
2.0000 mL | INJECTION | INTRAMUSCULAR | Status: DC | PRN
Start: 2017-06-01 — End: 2017-06-03

## 2017-06-01 MED ORDER — SENNOSIDES 8.6 MG-DOCUSATE SODIUM 50 MG TABLET
1.0000 | ORAL_TABLET | Freq: Two times a day (BID) | ORAL | Status: DC
Start: 2017-06-02 — End: 2017-06-03
  Administered 2017-06-02 – 2017-06-03 (×4): 0 via ORAL
  Filled 2017-06-01: qty 1

## 2017-06-01 MED ORDER — DOCUSATE SODIUM 100 MG CAPSULE
100.0000 mg | ORAL_CAPSULE | Freq: Two times a day (BID) | ORAL | Status: DC
Start: 2017-06-02 — End: 2017-06-03
  Administered 2017-06-02 – 2017-06-03 (×4): 0 mg via ORAL
  Filled 2017-06-01: qty 1

## 2017-06-01 MED ORDER — BACITRACIN 500 UNIT/G OINTMENT TUBE
TOPICAL_OINTMENT | Freq: Two times a day (BID) | CUTANEOUS | Status: DC
Start: 2017-06-02 — End: 2017-06-03
  Administered 2017-06-02: 0 via TOPICAL
  Filled 2017-06-01: qty 15

## 2017-06-01 NOTE — Nurses Notes (Signed)
[facility] placed 7NE-16.
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
## 2017-06-01 NOTE — Nurses Notes (Signed)
Patient arrived from ED to 7NE 16. Patient ambulated from stretcher to bed. Patient is alert and oriented x4. 97% on RA. Denies having any numbness or tingling. Patient complains of blurriness to left eye only. Patient states the pain is only in his nose and left eye, rating 2/10, denies any need for pain medication. Admission completed per doc flowsheets. Patient educated on hospital policies and call light system.
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
## 2017-06-01 NOTE — Procedures (Addendum)
[facility]   DEPARTMENT OF OPHTHALMOLOGY  PROCEDURE NOTE     Patient Name: [facility] Number: [id number]  Date of Service: 06/01/2017   Date of Birth: [birth year]    Location: Performed at bedside in the ED. \  Diagnosis:  left lower lid laceration Not involving canaliculus or canthus  Procedure: left lower lid lac repair, intermediate repair     Procedure note:  After the patient's identity and allergies were reviewed, informed consent was obtained. The skin surrounding the laceration was cleaned with alcohol prior to injection of anesthetic. Injection was performed with 1.5cc of 1% lidocaine with epinephrine. The wound was then irrigated with 10 cc NS and any foreign bodies or debris were removed. The area surrounding the wound was then prepped with betadine and draped in the usual sterile fashion.  The 7 mm laceration was repaired using 2 5-0 vicryl sutures in simple, deep interrupted fashion to appose orbicularis and then 6 interrupted 5-0 fast sutures to appose skin. The wound was then coated with Erythromycin 0.5% ophthalmic ointment.  The patient tolerated the procedure well.  There were no complications.      Karin Golden, MD 06/01/2017, 22:53          Late entry for 06/01/2017.   I was present for all key and/or critical portions of the case and immediately available at all times.    Lynett Fish, MD 06/02/2017, 16:42        Addendum to the above  procedure note.   I was present for all key and/or critical portions of the case, I was not present for the entire procedure but I was immediately available at all times.  Lynett Fish, MD  06/23/2017, 54:09
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
## 2017-06-02 ENCOUNTER — Encounter (HOSPITAL_COMMUNITY): Admission: EM | Payer: Self-pay | Source: Other Acute Inpatient Hospital | Attending: Emergency Medicine

## 2017-06-02 ENCOUNTER — Observation Stay (HOSPITAL_BASED_OUTPATIENT_CLINIC_OR_DEPARTMENT_OTHER): Payer: 59 | Admitting: Certified Registered"

## 2017-06-02 ENCOUNTER — Observation Stay (HOSPITAL_COMMUNITY): Payer: 59 | Admitting: Certified Registered"

## 2017-06-02 ENCOUNTER — Encounter (HOSPITAL_COMMUNITY): Payer: Self-pay

## 2017-06-02 DIAGNOSIS — S022XXA Fracture of nasal bones, initial encounter for closed fracture: Secondary | ICD-10-CM

## 2017-06-02 DIAGNOSIS — S0285XA Fracture of orbit, unspecified, initial encounter for closed fracture: Secondary | ICD-10-CM | POA: Diagnosis present

## 2017-06-02 DIAGNOSIS — S0512XA Contusion of eyeball and orbital tissues, left eye, initial encounter: Secondary | ICD-10-CM | POA: Diagnosis present

## 2017-06-02 LAB — CBC WITH DIFF
BASOPHIL #: 0.06 x10ˆ3/uL (ref 0.00–0.20)
BASOPHIL %: 0 %
EOSINOPHIL #: 0.06 10*3/uL (ref 0.00–0.50)
EOSINOPHIL %: 0 %
HCT: 43.5 % (ref 36.7–47.0)
HGB: 15.6 g/dL (ref 12.5–16.3)
LYMPHOCYTE #: 3.35 x10ˆ3/uL (ref 1.00–4.80)
LYMPHOCYTE %: 21 %
MCH: 28.8 pg (ref 27.4–33.0)
MCHC: 35.8 g/dL (ref 32.5–35.8)
MCV: 80.7 fL (ref 78.0–100.0)
MONOCYTE #: 1.21 x10ˆ3/uL — ABNORMAL HIGH (ref 0.30–1.00)
MONOCYTE %: 8 %
MPV: 7.1 fL — ABNORMAL LOW (ref 7.5–11.5)
NEUTROPHIL #: 11.26 x10ˆ3/uL — ABNORMAL HIGH (ref 1.50–7.70)
NEUTROPHIL %: 71 %
PLATELETS: 225 x10ˆ3/uL (ref 140–450)
RBC: 5.39 x10ˆ6/uL (ref 4.06–5.63)
RDW: 13 % (ref 12.0–15.0)
WBC: 15.9 10*3/uL — ABNORMAL HIGH (ref 3.5–11.0)

## 2017-06-02 LAB — ELECTROLYTES
ANION GAP: 9 mmol/L (ref 4–13)
ANION GAP: 9 mmol/L (ref 4–13)
CHLORIDE: 100 mmol/L (ref 96–111)
CHLORIDE: 100 mmol/L (ref 96–111)
CO2 TOTAL: 24 mmol/L (ref 22–32)
POTASSIUM: 3.8 mmol/L (ref 3.5–5.1)
POTASSIUM: 3.8 mmol/L (ref 3.5–5.1)
SODIUM: 133 mmol/L — ABNORMAL LOW (ref 136–145)
SODIUM: 133 mmol/L — ABNORMAL LOW (ref 136–145)

## 2017-06-02 LAB — CREATININE WITH EGFR
CREATININE: 0.9 mg/dL (ref 0.62–1.27)
ESTIMATED GFR: >59 mL/min/1.73mˆ2 (ref >59)

## 2017-06-02 LAB — BUN: BUN: 12 mg/dL (ref 8–25)

## 2017-06-02 SURGERY — CLOSED REDUCTION FRACTURE NASAL
Anesthesia: General | Site: Nose | Wound class: Clean Contaminated Wounds-The respiratory, GI, Genital, or urinary

## 2017-06-02 MED ORDER — FENTANYL (PF) 50 MCG/ML INJECTION SOLUTION
25.0000 ug | INTRAMUSCULAR | Status: DC | PRN
Start: 2017-06-02 — End: 2017-06-03

## 2017-06-02 MED ORDER — FENTANYL (PF) 50 MCG/ML INJECTION SOLUTION
Freq: Once | INTRAMUSCULAR | Status: DC | PRN
Start: 2017-06-02 — End: 2017-06-02
  Administered 2017-06-02: 50 ug via INTRAVENOUS

## 2017-06-02 MED ORDER — LIDOCAINE (PF) 100 MG/5 ML (2 %) INTRAVENOUS SYRINGE
INJECTION | Freq: Once | INTRAVENOUS | Status: DC | PRN
Start: 2017-06-02 — End: 2017-06-02
  Administered 2017-06-02: 100 mg via INTRAVENOUS

## 2017-06-02 MED ORDER — HYDROMORPHONE 2 MG/ML INJECTION SYRINGE
0.2000 mg | INJECTION | INTRAMUSCULAR | Status: DC | PRN
Start: 2017-06-02 — End: 2017-06-03

## 2017-06-02 MED ORDER — ATROPINE 1 % EYE DROPS
1.00 [drp] | Freq: Two times a day (BID) | OPHTHALMIC | Status: DC
Start: 2017-06-02 — End: 2017-06-03
  Administered 2017-06-02 – 2017-06-03 (×3): 1 [drp] via OPHTHALMIC
  Filled 2017-06-02: qty 2

## 2017-06-02 MED ORDER — LACTATED RINGERS INTRAVENOUS SOLUTION
INTRAVENOUS | Status: DC
Start: 2017-06-02 — End: 2017-06-03

## 2017-06-02 MED ORDER — OXYMETAZOLINE 0.05 % NASAL SPRAY
30.0000 mL | Freq: Once | NASAL | Status: DC | PRN
Start: 2017-06-02 — End: 2017-06-03
  Administered 2017-06-02: 30 mL via TOPICAL

## 2017-06-02 MED ORDER — IBUPROFEN 600 MG TABLET
600.00 mg | ORAL_TABLET | Freq: Four times a day (QID) | ORAL | Status: DC | PRN
Start: 2017-06-02 — End: 2017-06-03
  Filled 2017-06-02: qty 1

## 2017-06-02 MED ORDER — DEXMEDETOMIDINE 4 MCG/ML IV DILUTION
Freq: Once | INTRAMUSCULAR | Status: DC | PRN
Start: 2017-06-02 — End: 2017-06-02
  Administered 2017-06-02: 15:00:00 16 ug via INTRAVENOUS

## 2017-06-02 MED ORDER — OXYMETAZOLINE 0.05 % NASAL SPRAY
1.00 | Freq: Two times a day (BID) | NASAL | Status: DC
Start: 2017-06-02 — End: 2017-06-03
  Administered 2017-06-02 – 2017-06-03 (×3): 1 via NASAL
  Filled 2017-06-02: qty 30

## 2017-06-02 MED ORDER — ERYTHROMYCIN 5 MG/GRAM (0.5 %) EYE OINTMENT
TOPICAL_OINTMENT | Freq: Four times a day (QID) | OPHTHALMIC | Status: DC
Start: 2017-06-02 — End: 2017-06-03
  Administered 2017-06-02: 0 via OPHTHALMIC
  Filled 2017-06-02 (×2): qty 1

## 2017-06-02 MED ORDER — MIDAZOLAM 1 MG/ML INJECTION SOLUTION
Freq: Once | INTRAMUSCULAR | Status: DC | PRN
Start: 2017-06-02 — End: 2017-06-02
  Administered 2017-06-02: 2 mg via INTRAVENOUS

## 2017-06-02 MED ORDER — KETAMINE 10 MG/ML INJECTION WRAPPER
Freq: Once | INTRAMUSCULAR | Status: DC | PRN
Start: 2017-06-02 — End: 2017-06-02
  Administered 2017-06-02: 30 mg via INTRAVENOUS

## 2017-06-02 MED ORDER — ONDANSETRON HCL (PF) 4 MG/2 ML INJECTION SOLUTION
4.0000 mg | Freq: Once | INTRAMUSCULAR | Status: DC | PRN
Start: 2017-06-02 — End: 2017-06-03

## 2017-06-02 MED ORDER — PROPOFOL 10 MG/ML IV BOLUS
INJECTION | Freq: Once | INTRAVENOUS | Status: DC | PRN
Start: 2017-06-02 — End: 2017-06-02
  Administered 2017-06-02: 200 mg via INTRAVENOUS

## 2017-06-02 MED ORDER — LIDOCAINE 1 %-EPINEPHRINE 1:100,000 INJECTION SOLUTION
15.0000 mL | Freq: Once | INTRAMUSCULAR | Status: DC | PRN
Start: 2017-06-02 — End: 2017-06-03
  Administered 2017-06-02: 4 mL via INTRAMUSCULAR

## 2017-06-02 MED ORDER — SODIUM CHLORIDE 0.9 % (FLUSH) INJECTION SYRINGE
2.00 mL | INJECTION | INTRAMUSCULAR | Status: DC | PRN
Start: 2017-06-02 — End: 2017-06-03

## 2017-06-02 MED ORDER — HYDROMORPHONE 2 MG/ML INJECTION SYRINGE
0.4000 mg | INJECTION | INTRAMUSCULAR | Status: DC | PRN
Start: 2017-06-02 — End: 2017-06-03
  Filled 2017-06-02: qty 1

## 2017-06-02 MED ORDER — ONDANSETRON HCL (PF) 4 MG/2 ML INJECTION SOLUTION
Freq: Once | INTRAMUSCULAR | Status: DC | PRN
Start: 2017-06-02 — End: 2017-06-02
  Administered 2017-06-02: 4 mg via INTRAVENOUS

## 2017-06-02 MED ORDER — PREDNISOLONE ACETATE 1 % EYE DROPS,SUSPENSION
1.00 [drp] | Freq: Four times a day (QID) | OPHTHALMIC | Status: DC
Start: 2017-06-02 — End: 2017-06-03
  Administered 2017-06-02: 1 [drp] via OPHTHALMIC
  Administered 2017-06-02: 0 [drp] via OPHTHALMIC
  Administered 2017-06-02 – 2017-06-03 (×3): 1 [drp] via OPHTHALMIC
  Filled 2017-06-02: qty 5

## 2017-06-02 MED ORDER — FENTANYL (PF) 50 MCG/ML INJECTION SOLUTION
12.5000 ug | INTRAMUSCULAR | Status: DC | PRN
Start: 2017-06-02 — End: 2017-06-03

## 2017-06-02 MED ORDER — SODIUM CHLORIDE 0.9 % (FLUSH) INJECTION SYRINGE
2.00 mL | INJECTION | Freq: Three times a day (TID) | INTRAMUSCULAR | Status: DC
Start: 2017-06-02 — End: 2017-06-03
  Administered 2017-06-02: 15:00:00 0 mL
  Administered 2017-06-02: 2 mL
  Administered 2017-06-02: 0 mL

## 2017-06-02 MED ORDER — DEXAMETHASONE SODIUM PHOSPHATE 4 MG/ML INJECTION SOLUTION
Freq: Once | INTRAMUSCULAR | Status: DC | PRN
Start: 2017-06-02 — End: 2017-06-02
  Administered 2017-06-02: 8 mg via INTRAVENOUS

## 2017-06-02 MED ADMIN — lanolin-oxyquin-pet, hydrophil topical ointment: ORAL | @ 10:00:00 | NDC 09999989257

## 2017-06-02 MED ADMIN — sodium chloride 0.9 % (flush) injection syringe: ORAL | @ 08:00:00

## 2017-06-02 MED ADMIN — lactated Ringers intravenous solution: INTRAVENOUS | @ 15:00:00 | NDC 00338011704

## 2017-06-02 MED ADMIN — dexMEDEtomidine 100 mcg/mL intravenous solution: INTRAVENOUS | @ 15:00:00

## 2017-06-02 MED ADMIN — HYDROcodone 5 mg-acetaminophen 325 mg tablet: ORAL | @ 03:00:00

## 2017-06-02 MED ADMIN — prednisoLONE acetate 1 % eye drops,suspension: OPHTHALMIC | @ 21:00:00

## 2017-06-02 MED ADMIN — lactated Ringers intravenous solution: ORAL | @ 09:00:00

## 2017-06-02 SURGICAL SUPPLY — 37 items
ADH LIQUID LF  WTPRF VIAL PREP NONSTAIN MASTISOL STYRAX GUM MASTIC ALC MTHY SLCYT STRL CLR NHZR 2/3 (SEALANTS) ×1 IMPLANT
ADH LQ LF VIAL AMP PREP MASTI_SOL STYRAX GUM MASTIC ALC MTHY (SEALANTS) ×1
CONV USE 23866 - NEEDLE HYPO 27GA 1.5IN STD MONOJECT SS POLYPROP REG BVL LL HUB UL SHRP ANTICORE YW STRL LF  DISP (NEEDLES & SYRINGE SUPPLIES) ×1 IMPLANT
CONV USE ITEM 337890 - PACK SURG BSIN 2 STRL LF  DISP (CUSTOM TRAYS & PACK) ×1 IMPLANT
COVER 53X24IN MAYOSTAND PRXM STRL DISP EQP SMS LF (DRAPE/PACKS/SHEETS/OR TOWEL) ×1 IMPLANT
COVER WAND RFD STRL 50EA/CS_01-0020 (EQUIPMENT MINOR)
COVER WND RF DETECT STRL CLR EQP (EQUIPMENT MINOR) IMPLANT
DRAPE 2 LYR ABS 70X40IN MED UN_IV LF DISP SURG BILAMINATE (PROTECTIVE PRODUCTS/GARMENTS) ×1
DRAPE FNFLD SHEET 70X40IN MED PRXM LF  STRL DISP SURG SMS (PROTECTIVE PRODUCTS/GARMENTS) ×1 IMPLANT
DRAPE MAYOSTAND CVR 53X24IN PR_XM LF STRL DISP EQP SMS (DRAPE/PACKS/SHEETS/OR TOWEL) ×1
GAUZE SURG 4X4IN RFDETECT RF A_SSURE COTTON 16 PLY XRY DTBL (WOUND CARE/ENTEROSTOMAL SUPPLY) ×1
GAUZE SURG 4X4IN STD RFDETECT COTTON 16 PLY XRY ABS LF  STRL DISP (WOUND CARE SUPPLY) ×1 IMPLANT
GOWN SURG XL AAMI L3 NONREINFO_RCE HKLP CLSR STRL LTX PNK SMS (DGOW)
GOWN SURG XL L3 NONREINFORCE HKLP CLSR STRL LTX PNK SMS 47IN (DGOW) IMPLANT
KIT RM TURNOVER CLEANOP CSTM INFCT CONTROL (KITS & TRAYS (DISPOSABLE)) ×1
KIT RM TURNOVER CLEANOP CSTM I_NFCT CONTROL (KITS & TRAYS (DISPOSABLE)) ×1
KIT RM TURNOVER CLEANOP CUSTOM INFCT CONTROL (KITS & TRAYS (DISPOSABLE)) ×1 IMPLANT
LABEL E-Z STICK_STLEZP1 100EA/CS (LABELS/CHART SUPPLIES) ×1
LABEL MED EZ PEEL MRKR LF (LABELS/CHART SUPPLIES) ×1 IMPLANT
NEEDLE HYPO  27GA 1.5IN STD MONOJECT SS POLYPROP REG BVL LL (NEEDLES & SYRINGE SUPPLIES) ×1
NEEDLE SPINAL BLU 3IN 25GA QUINCKE STD LGTH REG WL POLYPROP STRL LF  DISP (NEEDLES & SYRINGE SUPPLIES) ×1 IMPLANT
NEEDLE SPINAL BLU 3IN 25GA QUI_NCKE STD LGTH BVL FIT STY CANN (NEEDLES & SYRINGE SUPPLIES) ×1
PACK BASIN DBL CUSTOM (CUSTOM TRAYS & PACK) ×1
PEN SURG MRKNG RCHRD-ALLAN SKIN FN TIP LBL RLR STRL LF (MARK) ×1 IMPLANT
PEN SURG MRKNG RCHRD-ALLAN SKN_FN TIP LBL RLR STRL LF (MARK) ×1
SPLINT NASAL 1/16 3X3 PERF_PS1688 10/PK (ENT SUPPLIES) ×1
SPLINT NASAL 3X3IN THK1/16IN THRMPLST AQ PS CSTM BLNK IVY (ENT SUPPLIES) ×1
SPLINT NASAL 3X3IN THK1/16IN THRMPLST AQ PS CUSTOM BLNK IVY (ENT SUPPLIES) ×1 IMPLANT
STRIP SURG XRAY DTBL 1/2 X 6IN_80-1451 (WOUND CARE SUPPLY) ×1 IMPLANT
STRIP SURG XRAY DTBL 1/2 X 6IN_80-1451 (WOUND CARE/ENTEROSTOMAL SUPPLY) ×1
SYRINGE BD LL 10ML LF STRL CO_NTROL CONCEN TIP PRGN FREE (NEEDLES & SYRINGE SUPPLIES) ×2
SYRINGE LL 10ML LF  STRL CONTROL CONCEN TIP PRGN FREE DEHP-FR MED DISP (NEEDLES & SYRINGE SUPPLIES) ×1 IMPLANT
TAPE MICROPORE 1/2IN BRN 24/BX_15330 (MED/SURG TAPES) IMPLANT
TIP SUCT ARGYLE CURITY FRZR 10FR BRSS PLASTIC NI REM OBDURATOR NCDTV HNDL STRL LF  DISP (SURGICAL INSTRUMENTS) ×1 IMPLANT
TUBE FRAZIER INST SUCT 10FR_166025 (INSTRUMENTS) ×1
TUBING SUCT CLR 20FT 9/32IN MEDIVAC NCDTV M/M CONN STRL LF (Suction) ×1 IMPLANT
TUBING SUCT CONN 20FT LONG_STRL N720A (Suction) ×1

## 2017-06-02 NOTE — Nursing Note (Signed)
Public relations account executive

## 2017-06-02 NOTE — Care Plan (Signed)
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
## 2017-06-02 NOTE — Anesthesia Preprocedure Evaluation (Signed)
ANESTHESIA PRE-OP EVALUATION  Planned Procedure: CLOSED REDUCTION FRACTURE NASAL (N/A Nose)  Review of Systems                   Pulmonary     Cardiovascular    No peripheral edema,        GI/Hepatic/Renal        Endo/Other          Neuro/Psych/MS       Cancer                 Physical Assessment      Airway       Mallampati: I      Neck ROM: full  Mouth Opening: good.      No endotracheal tube present  No Tracheostomy present    Dental           (+) edentulous           Pulmonary    Breath sounds clear to auscultation  (-) no rhonchi, no decreased breath sounds, no wheezes, no rales and no stridor     Cardiovascular    Rhythm: regular  Rate: Normal  (-) no friction rub, carotid bruit is not present, no peripheral edema and no murmur     Other findings            Plan  Planned anesthesia type: general    ASA 2     Intravenous induction     Anesthetic plan and risks discussed with patient.     Anesthesia issues/risks discussed are: Dental Injuries, PONV, Post-op Pain Management and Cardiac Events/MI.        Patient's NPO status is appropriate for Anesthesia.           Plan discussed with CRNA.                     Other Studies:   Lab results from outside facility :      Consults:     Patient instructed to

## 2017-06-02 NOTE — Anesthesia Transfer of Care (Signed)
ANESTHESIA TRANSFER OF CARE   Donald Ford is a 46 y.o. ,male, Weight: 88.2 kg (194 lb 7.1 oz)   had Procedure(s):  CLOSED REDUCTION FRACTURE NASAL  performed  06/02/17   Primary Service: Odis Luster Dav*    Past Medical History:   Diagnosis Date   . HTN (hypertension)    . IVDU (intravenous drug user)     marijuanna, IVDU, amphetamine      Allergy History as of 06/02/17      No Known Allergies              I completed my transfer of care / handoff to the receiving personnel during which we discussed:  Access, Airway, All key/critical aspects of case discussed, Analgesia, Antibiotics, Expectation of post procedure, Fluids/Product, Gave opportunity for questions and acknowledgement of understanding, Labs and PMHx                                              Additional Info:Pt taken to PACU on 10lfm, vss, report to RN.                      Last OR Temp: Temperature: 36.3 C (97.3 F)  ABG:  POTASSIUM   Date Value Ref Range Status   06/02/2017 3.8 3.5 - 5.1 mmol/L Final     Airway:   Blood pressure 102/68, pulse 66, temperature 36.3 C (97.3 F), resp. rate (!) 10, height 1.829 m (6' 0.01"), weight 88.2 kg (194 lb 7.1 oz), SpO2 96 %.
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
## 2017-06-02 NOTE — OR Surgeon (Signed)
[facility]  DEPARTMENT OF OTOLARYNGOLOGY - HEAD AND NECK SURGERY   OPERATIVE REPORT    NAME: Donald Ford, 46 y.o. male  MRN: [id number]  DOB: 1971/07/15  DATE OF SERVICE: 06/02/2017    PREOPERATIVE DIAGNOSES:  1.  Bilateral nasal bone fracture      POSTOPERATIVE DIAGNOSES: Same    PROCEDURE:  1. Closed nasal reduction     SURGEONS: Marnee Guarneri, MD (Primary Surgeon), Blenda Nicely, MD (Staff Surgeon)    ANESTHESIA:  General Anesthesia administered via LMA    ESTIMATED BLOOD LOSS: Minimal    FLUIDS: None    OPERATIVE FINDINGS: bilateral nasal bone fracture deviating external pyramid to the right.. Bilateral nasal bone fracture with displacement of the whole upper 1/3 of the nose to the right. Fractures reduced audible sound and palpable reduction. After reduction external pyramid midline.  SPECIMENS:  None     COMPLICATIONS:  None    DESCRIPTION OF PROCEDURE:  After ensuring we had appropriate informed consent and patient identification in the preoperative holding area, the patient was escorted back to the operative suite by both Otolaryngology and anesthesia. Once in the operating room a surgical pause was conducted  to further ensure appropriate patient and procedure identification. General anesthesia was then induced and administered via LMA.  Once given the go-ahead by anesthesia, the patient was prepped and draped for closed nasal reduction.  Afrin soaked pledgets were placed into both nasal cavities for decongestion. 5cc of 1% Lidocaine with 1:100,000 epinephrine was injected internally and externally around nasal bones and nasal septum. A [place] elevator was placed in the midline with external manuel pressure for an audible and palpable reduction.  After reduction the external pyramid was midline without defects.  Septum appears near to midline without significant nasal bleeding.  Mastisol was painted onto the lateral nasal side walls.  Brown tape was placed then an external splint was placed in  hot water then molded on the external nose.  The splint dried and hardened then brown tape was again placed over the splint.  The patient was then allowed to wake up and taken to the postoperative recovery area in stable condition.  Dr. Ardean Larsen was present for the entire case.    CONDITION:  Stable    DISPOSITION:   Please discharge home when meets RDSC criteria.      Dinisha Cai A. Tylynn Braniff, M.D.  [id number]  06/02/2017 14:59

## 2017-06-02 NOTE — Nurses Notes (Signed)
Pt returned to 7NE 16 from OR with guard at bedside. Alert and oriented x4. SpO2 97% on room air. Denying pain at this time. Tape dressing to nose CDI. Still reporting blurry vision. Pt does not currently have eye shield on. Will call PACU or reorder new one. Will continue to monitor.
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
## 2017-06-02 NOTE — Nurses Notes (Signed)
Pt off floor to pre-op area at this time with guard. Report given to pre-op RN.

## 2017-06-02 NOTE — Progress Notes (Signed)
[facility]  DEPARTMENT OF OTOLARYNGOLOGY - HEAD AND NECK SURGERY  INPATIENT PROGRESS NOTE             Current Date: 06/02/2017, 14:18  Name: Donald Ford, 46 y.o. male  MRN: [id number]  DOB: 02-Jan-1971  Date of Admission: 06/01/2017    SUBJECTIVE:   NAEO.  Reports tenderness to nose    OBJECTIVE:   Vitals:   Temp (24hrs) Max:37.1 C (98.8 F)  Temperature: 36.5 C (97.7 F)  BP (Non-Invasive): 140/90  MAP (Non-Invasive): 103 mmHG  Heart Rate: 68  Respiratory Rate: 14  SpO2-1: 94 %   Today's Physical Exam:  NAD, breathing comfortably.  Nose: Right, right, right external pyramid septum deivated to the right      I/O:    I/O: Last 24 hours       I/O: Last Shift           Labs/Imaging/Studies:  Lab Results   Component Value Date    WBC 15.9 (H) 06/02/2017    HGB 15.6 06/02/2017    HCT 43.5 06/02/2017    PLTCNT 225 06/02/2017    PMNS 71 06/02/2017       Recent Labs      06/02/17   0520   SODIUM  133*   POTASSIUM  3.8   CHLORIDE  100   BUN  12   CREATININE  0.90            ASSESSMENT & PLAN:  Donald Ford is a 46 y.o. male,  LOS: 1 day  who is Day of Surgery for CNR    Plan:   Plan for CNR.  Avoid trauma to nose after surgery.  RTC 1 wk.    Marnee Guarneri, MD  06/02/2017, 14:18

## 2017-06-02 NOTE — Anesthesia Postprocedure Evaluation (Signed)
Anesthesia Post Op Evaluation    Patient: Donald Ford  Procedure(s):  CLOSED REDUCTION FRACTURE NASAL    Last Vitals:Temperature: 36.6 C (97.9 F) (06/02/17 1545)  Heart Rate: 82 (06/02/17 1545)  BP (Non-Invasive): 102/73 (06/02/17 1515)  Respiratory Rate: 15 (06/02/17 1545)  SpO2-1: 96 % (06/02/17 1545)  Pain Score (Numeric, Faces): 0 (06/02/17 1545)  Patient is sufficiently recovered from the effects of anesthesia to participate in the evaluation and has returned to their pre-procedure level.  Patient location during evaluation: PACU   Post-procedure handoff checklist completed    Patient participation: complete - patient participated  Level of consciousness: awake and alert and responsive to verbal stimuli  Pain management: adequate  Airway patency: patent  Anesthetic complications: no  Cardiovascular status: acceptable  Respiratory status: acceptable  Hydration status: acceptable  Patient post-procedure temperature: Pt Normothermic   PONV Status: Absent

## 2017-06-02 NOTE — Nurses Notes (Addendum)
Patient wanting something for pain, patient only has PO medications available but has strict NPO orders. Text paged MD Reside with [facility] to clarify that patient can have sips of water with medications. MD returned page and stated ok to give patient sips of water with medications. Will continue to monitor patient.

## 2017-06-02 NOTE — Nurses Notes (Signed)
Patient meets criteria to return to the floor. VSS. Report called to the floor and accepted. Awaiting transport.

## 2017-06-02 NOTE — Care Management Notes (Signed)
Utilization Review Determination    SECTION I  Reason for Physician Advisor Referral: Does not meet inpatient criteria. 06/01/17    Current MERLIN MD Order: inpt    Utilization Review Findings: Patient meets observation status.  Utilization Review MD: Dr. Silvio Clayman    Based on clinical information available to me as per above and in chart, the patient's status should be: Observation    Antoine Primas, Utilization Review RN 06/02/2017, 12:01  -------------------------------------------------------------------------------------------------------------------  1.  I have reviewed this case with the patient's treating NP Willeen Cass, A, and the MD agrees with this change in status.  They are aware of the referral to the Utilization Review Committee.    2.  The patient 's DCP (Daniher, F) has been notified  of changes in level of care.  Please refer to the education documentation and/or [employer] for specific time of delivery.    Antoine Primas, Utilization Review RN 06/02/2017, 12:01  (Patient notification is required only if the status is changed while an Inpatient to Observation Status)  -------------------------------------------------------------------------------------------------------------------

## 2017-06-02 NOTE — Care Plan (Signed)
Problem: Patient Care Overview (Adult,OB)  Goal: Plan of Care Review(Adult,OB)  The patient and/or their representative will communicate an understanding of their plan of care   Outcome: Ongoing (see interventions/notes)  Goal: Pain control: PRN and scheduled medications, ice, and repositioning.  Goal: Improve Mobility: Patient OOB for meals, ambulate 3 times a day for a total of 150 feet. PT/OT evaluation- home.  Goal: Improve Oxygenation: supplemental oxygen as needed, encourage IS, and deep breathing.  Goal: DVT Prevention: TED hose, SCD, ambulation, and adequate hydration are encouraged and maintained.  Goal: Safety: hourly rounding, and educate on using call bell for assistance before ambulating/toileting.  Goal: Infection Prevention: 24 hours of antibiotics, hand hygiene education, and maintaining a clean environment.    Patient has been resting throughout the night. Patient's pain is being controlled with PRN medications. Patient is OOB independently with guard at bedside. Plan is for patient to possibly go to OR today, if no OR possible d/c today pending pain control. POC reviewed with patient. Call light within reach at all times. Will continue to monitor patient.  \  Goal: Individualization/Patient Specific Goal(Adult/OB)  Outcome: Ongoing (see interventions/notes)    Goal: Interdisciplinary Rounds/Family Conf  Outcome: Ongoing (see interventions/notes)      Problem: Pain, Acute (Adult)  Goal: Identify Related Risk Factors and Signs and Symptoms  Related risk factors and signs and symptoms are identified upon initiation of Human Response Clinical Practice Guideline (CPG).   Outcome: Completed Date Met: 06/02/17    Goal: Acceptable Pain Control/Comfort Level  Patient will demonstrate the desired outcomes by discharge/transition of care.   Outcome: Ongoing (see interventions/notes)      Problem: Fracture Orthopaedic (Adult)  Prevent and manage potential problems including:  1. bleeding  2. delayed  union/nonunion  3. embolism  4. functional deficit/self-care deficit  5. gastrointestinal complications  6. infection  7. neurovascular impairment/injury  8. pain  9. respiratory compromise  10. situational response  11. skin integrity impairment  12. urinary retention   Goal: Signs and Symptoms of Listed Potential Problems Will be Absent, Minimized or Managed (Fracture Orthopaedic)  Signs and symptoms of listed potential problems will be absent, minimized or managed by discharge/transition of care (reference Fracture Orthopaedic (Adult) CPG).   Outcome: Ongoing (see interventions/notes)
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
## 2017-06-03 ENCOUNTER — Ambulatory Visit (INDEPENDENT_AMBULATORY_CARE_PROVIDER_SITE_OTHER): Payer: Self-pay | Admitting: Otolaryngology

## 2017-06-03 MED ORDER — ERYTHROMYCIN 5 MG/GRAM (0.5 %) EYE OINTMENT
TOPICAL_OINTMENT | Freq: Four times a day (QID) | OPHTHALMIC | 0 refills | Status: DC
Start: 2017-06-03 — End: 2017-07-03

## 2017-06-03 MED ORDER — ACETAMINOPHEN 300 MG-CODEINE 30 MG TABLET
1.0000 | ORAL_TABLET | ORAL | Status: DC | PRN
Start: 2017-06-03 — End: 2017-06-03

## 2017-06-03 MED ORDER — SENNOSIDES 8.6 MG-DOCUSATE SODIUM 50 MG TABLET: 1 | Tab | Freq: Two times a day (BID) | ORAL | 0 refills | 0 days | Status: DC

## 2017-06-03 MED ORDER — AMOXICILLIN 875 MG-POTASSIUM CLAVULANATE 125 MG TABLET: 1 | Tab | Freq: Two times a day (BID) | ORAL | 0 refills | 0 days | Status: AC

## 2017-06-03 MED ORDER — ATROPINE 1 % EYE DROPS: 1 [drp] | Bottle | Freq: Two times a day (BID) | OPHTHALMIC | 0 refills | 0 days | Status: DC

## 2017-06-03 MED ORDER — PREDNISOLONE ACETATE 1 % EYE DROPS,SUSPENSION: 1 [drp] | Bottle | Freq: Four times a day (QID) | OPHTHALMIC | 0 refills | 0 days | Status: DC

## 2017-06-03 MED ORDER — ACETAMINOPHEN 300 MG-CODEINE 30 MG TABLET
2.0000 | ORAL_TABLET | ORAL | Status: DC | PRN
Start: 2017-06-03 — End: 2017-06-03
  Administered 2017-06-03: 2 via ORAL
  Filled 2017-06-03: qty 2

## 2017-06-03 MED ORDER — ACETAMINOPHEN 300 MG-CODEINE 30 MG TABLET
1.0000 | ORAL_TABLET | ORAL | 0 refills | Status: DC | PRN
Start: 2017-06-03 — End: 2017-07-03

## 2017-06-03 MED ORDER — HYDROCODONE 5 MG-ACETAMINOPHEN 325 MG TABLET
1.0000 | ORAL_TABLET | Freq: Four times a day (QID) | ORAL | Status: DC | PRN
Start: 2017-06-03 — End: 2017-06-03

## 2017-06-03 MED ADMIN — sodium chloride 0.9 % (flush) injection syringe: @ 06:00:00

## 2017-06-03 MED ADMIN — sodium chloride 0.9 % (flush) injection syringe: ORAL | @ 07:00:00

## 2017-06-03 MED ADMIN — docusate sodium 100 mg capsule: ORAL | @ 09:00:00
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
## 2017-06-03 NOTE — Telephone Encounter (Signed)
-----   Message from [facility] sent at 06/02/2017  3:15 PM EDT -----  Dr. Ardean Larsen pt//////////////////pt had surgery today and is admitted but traci would like to get week f/u scheduled, she only has the 24 and the 26 available for next week. Please call and advise.thank you

## 2017-06-03 NOTE — Care Plan (Signed)
Problem: Patient Care Overview (Adult,OB)  Goal: Plan of Care Review(Adult,OB)  The patient and/or their representative will communicate an understanding of their plan of care   Outcome: Ongoing (see interventions/notes)  Goal: Activity - ambulate OOB, ROM  Goal: Safety - call bell within reach, sitter select, OOB with standby assist  Goal: Pain Control - PRN pain meds  Goal: Infection Prevention - hand hygiene, patient education, IV antibiotics  Goal: DVT Prevention - ambulation, SCD's, TED hose     Patient rested throughout the night pain controlled by PRN pain meds. Patient is tolerating diet well with no n/v present. Dressing CDI and denies numbness and tingling. SCD's and TED hose in place. MIVF and IV antibiotic continued. Patient ambulates with standby assist. Call light in reach. Plan of care reviewed with patient.    Goal: Individualization/Patient Specific Goal(Adult/OB)  Outcome: Ongoing (see interventions/notes)      Problem: Pain, Acute (Adult)  Goal: Acceptable Pain Control/Comfort Level  Patient will demonstrate the desired outcomes by discharge/transition of care.   Outcome: Ongoing (see interventions/notes)      Problem: Fracture Orthopaedic (Adult)  Prevent and manage potential problems including:  1. bleeding  2. delayed union/nonunion  3. embolism  4. functional deficit/self-care deficit  5. gastrointestinal complications  6. infection  7. neurovascular impairment/injury  8. pain  9. respiratory compromise  10. situational response  11. skin integrity impairment  12. urinary retention   Goal: Signs and Symptoms of Listed Potential Problems Will be Absent, Minimized or Managed (Fracture Orthopaedic)  Signs and symptoms of listed potential problems will be absent, minimized or managed by discharge/transition of care (reference Fracture Orthopaedic (Adult) CPG).   Outcome: Ongoing (see interventions/notes)      Problem: Fall Risk (Adult)  Goal: Identify Related Risk Factors and Signs and Symptoms   Related risk factors and signs and symptoms are identified upon initiation of Human Response Clinical Practice Guideline (CPG).   Outcome: Ongoing (see interventions/notes)    Goal: Absence of Falls  Patient will demonstrate the desired outcomes by discharge/transition of care.   Outcome: Ongoing (see interventions/notes)
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
## 2017-06-03 NOTE — Telephone Encounter (Signed)
Patient has appointment 10/26 @ 4:00

## 2017-06-03 NOTE — Nurses Notes (Signed)
Pt discharged via wheel chair in care of correctional facility. No change in pt condition.

## 2017-06-03 NOTE — ED Attending Note (Signed)
Patient was seen primarily by me.  Neither a resident nor a APP participated in this patient's care.  Please see ED Primary Note for documentation of patient's encounter.    Jerl Mina, MD  06/03/2017, 18:30
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
## 2017-06-03 NOTE — Telephone Encounter (Signed)
Patient already has an appointment at 4:00 pm on Oct. 26th

## 2017-06-03 NOTE — Telephone Encounter (Signed)
-----   Message from Lars Pinks sent at 06/02/2017 12:19 PM EDT -----  French Ana from the medical department returning a call regarding getting the patient in for surgery.     I did see the order for ENT    French Ana is thinking he will need surgery and is wanting to talk to a Dr. Melynda Keller Nurse prior to setting up an appointment.       [phone]
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
## 2017-06-03 NOTE — Discharge Instructions (Signed)
Discharge Recommendations/ Plan:Discharge [id number] Facility ([address], [place]) (code 1)      Resources: [facility], [phone]; ask for infirmary

## 2017-06-10 ENCOUNTER — Ambulatory Visit
Admission: RE | Admit: 2017-06-10 | Discharge: 2017-06-10 | Disposition: A | Discharge status: Home / Self Care | Payer: 59 | Source: Ambulatory Visit | Attending: Ophthalmology | Admitting: Ophthalmology

## 2017-06-10 ENCOUNTER — Ambulatory Visit (HOSPITAL_BASED_OUTPATIENT_CLINIC_OR_DEPARTMENT_OTHER): Payer: 59 | Admitting: Ophthalmology

## 2017-06-10 ENCOUNTER — Encounter (INDEPENDENT_AMBULATORY_CARE_PROVIDER_SITE_OTHER): Payer: Self-pay | Admitting: Ophthalmology

## 2017-06-10 DIAGNOSIS — S0282XD Fracture of other specified skull and facial bones, left side, subsequent encounter for fracture with routine healing: Secondary | ICD-10-CM | POA: Insufficient documentation

## 2017-06-10 DIAGNOSIS — S058X2D Other injuries of left eye and orbit, subsequent encounter: Secondary | ICD-10-CM | POA: Insufficient documentation

## 2017-06-10 DIAGNOSIS — S0282XA Fracture of other specified skull and facial bones, left side, initial encounter for closed fracture: Secondary | ICD-10-CM

## 2017-06-10 DIAGNOSIS — S0285XA Fracture of orbit, unspecified, initial encounter for closed fracture: Secondary | ICD-10-CM

## 2017-06-10 DIAGNOSIS — H2102 Hyphema, left eye: Secondary | ICD-10-CM | POA: Insufficient documentation

## 2017-06-10 DIAGNOSIS — F17210 Nicotine dependence, cigarettes, uncomplicated: Secondary | ICD-10-CM | POA: Insufficient documentation
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
## 2017-06-13 ENCOUNTER — Encounter (INDEPENDENT_AMBULATORY_CARE_PROVIDER_SITE_OTHER): Payer: Self-pay | Admitting: Otolaryngology

## 2017-06-13 ENCOUNTER — Ambulatory Visit: Payer: 59 | Attending: Otolaryngology | Admitting: Otolaryngology

## 2017-06-13 VITALS — BP 140/84 | HR 82 | Temp 96.7°F | Ht 72.0 in | Wt 198.2 lb

## 2017-06-13 DIAGNOSIS — S0282XD Fracture of other specified skull and facial bones, left side, subsequent encounter for fracture with routine healing: Secondary | ICD-10-CM | POA: Insufficient documentation

## 2017-06-13 DIAGNOSIS — S022XXA Fracture of nasal bones, initial encounter for closed fracture: Secondary | ICD-10-CM
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
## 2017-07-02 ENCOUNTER — Ambulatory Visit (INDEPENDENT_AMBULATORY_CARE_PROVIDER_SITE_OTHER): Payer: Self-pay

## 2017-07-02 NOTE — Telephone Encounter (Signed)
Regarding: floaters   ----- Message from Tommie Raymondassie Terlosky sent at 07/02/2017  3:04 PM EST -----  Pt is seeing floaters/ and spider like vision on L side constantly there.    Started a few weeks ago but got worse today.  He is describing it as Grisel.      Dr. Lou MinerHuffman isn't sure if he needs seen right away or not     Thanks

## 2017-07-02 NOTE — Telephone Encounter (Signed)
Spoke to Dr. Lou MinerHuffman. Pt reports that he has had floater in left peripheral vision for a few weeks that comes and goes a few times a day. Denies flashes of light, multiple floaters, curtain/veil over vision, and pain. Offered to see patient tomorrow morning at 0745 or 0800 with Dr. Hayden RasmussenGhorayeb for "floaters." Dr. Lou MinerHuffman to call back to confirm. Audry PiliGayle L Trinitee Horgan, RN  07/02/2017, 15:17

## 2017-07-02 NOTE — Telephone Encounter (Signed)
Regarding: floaters   ----- Message from Tommie Raymondassie Terlosky sent at 07/02/2017  3:57 PM EST -----  French Anaracy called back pt will be there tomorrow at 8    ----- Message from [facility] Terlosky sent at 07/02/2017  3:04 PM EST -----  Pt is seeing floaters/ and spider like vision on L side constantly there.    Started a few weeks ago but got worse today.  He is describing it as Grisel.      Dr. Lou MinerHuffman isn't sure if he needs seen right away or not     Thanks

## 2017-07-02 NOTE — Telephone Encounter (Signed)
Scheduled pt for 07/03/17 at 0800 with Dr. Hayden RasmussenGhorayeb. Audry PiliGayle L Torii Royse, RN  07/02/2017, 15:59

## 2017-07-03 ENCOUNTER — Ambulatory Visit (INDEPENDENT_AMBULATORY_CARE_PROVIDER_SITE_OTHER): Payer: Self-pay | Admitting: Ophthalmology

## 2017-07-03 ENCOUNTER — Ambulatory Visit (HOSPITAL_BASED_OUTPATIENT_CLINIC_OR_DEPARTMENT_OTHER): Payer: 59 | Admitting: Ophthalmology

## 2017-07-03 ENCOUNTER — Encounter (INDEPENDENT_AMBULATORY_CARE_PROVIDER_SITE_OTHER): Payer: Self-pay | Admitting: Ophthalmology

## 2017-07-03 ENCOUNTER — Ambulatory Visit
Admission: RE | Admit: 2017-07-03 | Discharge: 2017-07-03 | Disposition: A | Discharge status: Home / Self Care | Payer: 59 | Source: Ambulatory Visit | Attending: Ophthalmology | Admitting: Ophthalmology

## 2017-07-03 DIAGNOSIS — S058X2A Other injuries of left eye and orbit, initial encounter: Secondary | ICD-10-CM | POA: Insufficient documentation

## 2017-07-03 DIAGNOSIS — H43392 Other vitreous opacities, left eye: Secondary | ICD-10-CM

## 2017-07-03 DIAGNOSIS — S0282XA Fracture of other specified skull and facial bones, left side, initial encounter for closed fracture: Secondary | ICD-10-CM | POA: Insufficient documentation

## 2017-07-03 DIAGNOSIS — F17210 Nicotine dependence, cigarettes, uncomplicated: Secondary | ICD-10-CM | POA: Insufficient documentation

## 2017-07-03 DIAGNOSIS — S0512XA Contusion of eyeball and orbital tissues, left eye, initial encounter: Secondary | ICD-10-CM

## 2017-07-03 DIAGNOSIS — H40003 Preglaucoma, unspecified, bilateral: Secondary | ICD-10-CM

## 2017-07-03 DIAGNOSIS — S0285XA Fracture of orbit, unspecified, initial encounter for closed fracture: Secondary | ICD-10-CM

## 2017-07-03 DIAGNOSIS — H35412 Lattice degeneration of retina, left eye: Secondary | ICD-10-CM | POA: Insufficient documentation
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
## 2017-07-03 NOTE — Telephone Encounter (Signed)
Advised Traci per chart note. No further concerns. Audry PiliGayle L Marcianna Daily, RN  07/03/2017, 14:40    Per chart note from today:    The patient may benefit from new glasses/refraction, and will ask the patient's unit to follow this. 

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
## 2017-07-24 ENCOUNTER — Ambulatory Visit (HOSPITAL_BASED_OUTPATIENT_CLINIC_OR_DEPARTMENT_OTHER): Payer: 59 | Admitting: Ophthalmology

## 2017-07-24 ENCOUNTER — Encounter (INDEPENDENT_AMBULATORY_CARE_PROVIDER_SITE_OTHER): Payer: Self-pay | Admitting: Ophthalmology

## 2017-07-24 ENCOUNTER — Ambulatory Visit
Admission: RE | Admit: 2017-07-24 | Discharge: 2017-07-24 | Disposition: A | Discharge status: Home / Self Care | Payer: 59 | Source: Ambulatory Visit | Attending: Ophthalmology | Admitting: Ophthalmology

## 2017-07-24 DIAGNOSIS — F17210 Nicotine dependence, cigarettes, uncomplicated: Secondary | ICD-10-CM | POA: Insufficient documentation

## 2017-07-24 DIAGNOSIS — S0285XA Fracture of orbit, unspecified, initial encounter for closed fracture: Secondary | ICD-10-CM

## 2017-07-24 DIAGNOSIS — Z79899 Other long term (current) drug therapy: Secondary | ICD-10-CM | POA: Insufficient documentation

## 2017-07-24 DIAGNOSIS — S0282XD Fracture of other specified skull and facial bones, left side, subsequent encounter for fracture with routine healing: Secondary | ICD-10-CM | POA: Insufficient documentation

## 2017-07-24 DIAGNOSIS — S0282XA Fracture of other specified skull and facial bones, left side, initial encounter for closed fracture: Secondary | ICD-10-CM
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
## 2017-08-06 ENCOUNTER — Encounter (INDEPENDENT_AMBULATORY_CARE_PROVIDER_SITE_OTHER): Payer: Self-pay | Admitting: Ophthalmology

## 2017-08-27 DIAGNOSIS — G4733 Obstructive sleep apnea (adult) (pediatric): Secondary | ICD-10-CM | POA: Diagnosis not present

## 2017-08-28 DIAGNOSIS — G4733 Obstructive sleep apnea (adult) (pediatric): Secondary | ICD-10-CM | POA: Diagnosis not present

## 2017-09-02 DIAGNOSIS — G4733 Obstructive sleep apnea (adult) (pediatric): Secondary | ICD-10-CM | POA: Diagnosis not present

## 2017-10-03 DIAGNOSIS — G4733 Obstructive sleep apnea (adult) (pediatric): Secondary | ICD-10-CM | POA: Diagnosis not present

## 2017-10-31 DIAGNOSIS — M79641 Pain in right hand: Secondary | ICD-10-CM | POA: Diagnosis not present

## 2017-10-31 DIAGNOSIS — G4733 Obstructive sleep apnea (adult) (pediatric): Secondary | ICD-10-CM | POA: Diagnosis not present

## 2017-10-31 DIAGNOSIS — S62336A Displaced fracture of neck of fifth metacarpal bone, right hand, initial encounter for closed fracture: Secondary | ICD-10-CM | POA: Diagnosis not present

## 2017-12-01 DIAGNOSIS — G4733 Obstructive sleep apnea (adult) (pediatric): Secondary | ICD-10-CM | POA: Diagnosis not present

## 2017-12-02 DIAGNOSIS — G4733 Obstructive sleep apnea (adult) (pediatric): Secondary | ICD-10-CM | POA: Diagnosis not present

## 2017-12-04 DIAGNOSIS — G4733 Obstructive sleep apnea (adult) (pediatric): Secondary | ICD-10-CM | POA: Diagnosis not present

## 2017-12-31 DIAGNOSIS — G4733 Obstructive sleep apnea (adult) (pediatric): Secondary | ICD-10-CM | POA: Diagnosis not present

## 2018-01-31 DIAGNOSIS — G4733 Obstructive sleep apnea (adult) (pediatric): Secondary | ICD-10-CM | POA: Diagnosis not present

## 2018-02-26 ENCOUNTER — Ambulatory Visit (INDEPENDENT_AMBULATORY_CARE_PROVIDER_SITE_OTHER): Payer: 59

## 2018-02-26 ENCOUNTER — Ambulatory Visit: Payer: 59 | Admitting: Podiatry

## 2018-02-26 ENCOUNTER — Other Ambulatory Visit: Payer: Self-pay | Admitting: Podiatry

## 2018-02-26 DIAGNOSIS — M722 Plantar fascial fibromatosis: Secondary | ICD-10-CM

## 2018-02-26 DIAGNOSIS — M79672 Pain in left foot: Secondary | ICD-10-CM | POA: Diagnosis not present

## 2018-02-26 MED ORDER — MELOXICAM 15 MG PO TABS: 15.0000 mg | ORAL_TABLET | Freq: Every day | ORAL | 2 refills | Status: AC

## 2018-02-26 NOTE — Patient Instructions (Signed)

## 2018-03-01 NOTE — Progress Notes (Signed)
Subjective:   Patient ID: Brendan Rodriguez, male   DOB: 47 y.o.   MRN: [id number]   HPI 47 year old male presents the office today for concerns of pain to the bottom of his left heel.  He states this is been ongoing for about 1 year is been getting worse.  He describes a sharp pain intermittent to the plantar aspect of the heel.  Is tried stretching as well as Aleve.  He denies any numbness or tingling.  The pain does not wake him up at night.  He has no other concerns at this time.   Review of Systems  All other systems reviewed and are negative.  Past Medical History:  Diagnosis Date  . Torn medial meniscus    left knee    Past Surgical History:  Procedure Laterality Date  . KNEE ARTHROSCOPY Right    '95 torned menicus  . KNEE ARTHROSCOPY Left 05/13/2014   Procedure: Left knee arthroscopy with partial medial meniscectomy;  Surgeon: Kathryne Hitch, MD;  Location: [facility];  Service: Orthopedics;  Laterality: Left;  . TONSILLECTOMY    . VASECTOMY       Current Outpatient Medications:  .  escitalopram (LEXAPRO) 10 MG tablet, , Disp: , Rfl:  .  HYDROcodone-acetaminophen (NORCO) 5-325 MG per tablet, Take 1-2 tablets by mouth every 4 (four) hours as needed for moderate pain., Disp: 60 tablet, Rfl: 0 .  naproxen sodium (ANAPROX) 220 MG tablet, Take 440 mg by mouth daily., Disp: , Rfl:  .  tetrahydrozoline (VISINE) 0.05 % ophthalmic solution, Place 1-2 drops into both eyes once as needed (allergies.)., Disp: , Rfl:  .  valACYclovir (VALTREX) 1000 MG tablet, , Disp: , Rfl: 2 .  valACYclovir (VALTREX) 500 MG tablet, Take 500-1,000 mg by mouth once as needed (cold sores.). Takes 2 tablets by mouth (1000mg 0 on day one and then one tablet (500 mg) daily thereafter until cold sore clears. (Usually 3 days), Disp: , Rfl:  .  meloxicam (MOBIC) 15 MG tablet, Take 1 tablet (15 mg total) by mouth daily., Disp: 30 tablet, Rfl: 2  Allergies  Allergen Reactions  . Penicillins     Light headed.  Fainted.         Objective:  Physical Exam  General: AAO x3, NAD  Dermatological: Skin is warm, dry and supple bilateral. Nails x 10 are well manicured; remaining integument appears unremarkable at this time. There are no open sores, no preulcerative lesions, no rash or signs of infection present.  Vascular: Dorsalis Pedis artery and Posterior Tibial artery pedal pulses are 2/4 bilateral with immedate capillary fill time. Pedal hair growth present. No varicosities and no lower extremity edema present bilateral. There is no pain with calf compression, swelling, warmth, erythema.   Neruologic: Grossly intact via light touch bilateral. Vibratory intact via tuning fork bilateral. Protective threshold with Semmes Wienstein monofilament intact to all pedal sites bilateral. Negative tinel sign.  Musculoskeletal: Tenderness to palpation along the plantar medial tubercle of the calcaneus at the insertion of plantar fascia on the left foot. There is no pain along the course of the plantar fascia within the arch of the foot. Plantar fascia appears to be intact. There is no pain with lateral compression of the calcaneus or pain with vibratory sensation. There is no pain along the course or insertion of the achilles tendon. No other areas of tenderness to bilateral lower extremities. Muscular strength 5/5 in all groups tested bilateral.  Gait: Unassisted, Nonantalgic.  Assessment:   47 year old male left heel pain, plantar fasciitis    Plan:  -Treatment options discussed including all alternatives, risks, and complications -Etiology of symptoms were discussed -X-rays were obtained and reviewed with the patient.  No evidence of acute fracture or stress fracture identified today. -Steroid injection performed.  See procedure note below. -Plantar fascial brace dispensed. -Prescribed mobic. Discussed side effects of the medication and directed to stop if any are to occur and call the office.   -Stretching, icing daily.  Discussed shoe modifications and orthotics.  Vivi BarrackMatthew R Elysabeth Aust DPM

## 2018-03-02 DIAGNOSIS — G4733 Obstructive sleep apnea (adult) (pediatric): Secondary | ICD-10-CM | POA: Diagnosis not present

## 2018-03-10 DIAGNOSIS — G4733 Obstructive sleep apnea (adult) (pediatric): Secondary | ICD-10-CM | POA: Diagnosis not present

## 2018-03-19 ENCOUNTER — Encounter: Payer: Self-pay | Admitting: Podiatry

## 2018-03-19 ENCOUNTER — Ambulatory Visit: Payer: 59 | Admitting: Podiatry

## 2018-03-19 DIAGNOSIS — M722 Plantar fascial fibromatosis: Secondary | ICD-10-CM

## 2018-03-23 DIAGNOSIS — M722 Plantar fascial fibromatosis: Secondary | ICD-10-CM | POA: Insufficient documentation

## 2018-03-23 NOTE — Progress Notes (Signed)
Subjective: 47 year old male presents the office today for concerns and follow-up evaluation of left heel pain.  Overall he states he is doing much better minimal discomfort at times but overall he is much improved.  He has been continue with stretching, icing exercises daily.  Denies any recent injury or trauma or any changes since I last saw him. Denies any systemic complaints such as fevers, chills, nausea, vomiting. No acute changes since last appointment, and no other complaints at this time.   Objective: AAO x3, NAD DP/PT pulses palpable bilaterally, CRT less than 3 seconds There is improved and very minimal tenderness palpation on the plantar medial tubercle of the calcaneus and insertion of plantar fashion left foot.  Plantar fascia appears to be intact.  No pain with lateral compression of the calcaneus.  Achilles tendon is intact.  No other areas of tenderness. No open lesions or pre-ulcerative lesions.  No pain with calf compression, swelling, warmth, erythema  Assessment: Much improved left heel pain, plantar fasciitis  Plan: -All treatment options discussed with the patient including all alternatives, risks, complications.  -Discussed another steroid injection but is very minimal discomfort states around one half of the injection.  Continue stretching, icing exercises daily.  Unfortunately insurance does not cover orthotics he is in a purchasing over-the-counter insert.  Discussed somewhat the purchase.  Continue with supportive shoes.  Follow-up if symptoms are not completely resolved the next couple weeks or if there is any recurrence or any other issues.  He agrees with this plan. -Patient encouraged to call the office with any questions, concerns, change in symptoms.   Vivi BarrackMatthew R Wagoner DPM

## 2018-04-02 DIAGNOSIS — G4733 Obstructive sleep apnea (adult) (pediatric): Secondary | ICD-10-CM | POA: Diagnosis not present

## 2018-05-03 DIAGNOSIS — G4733 Obstructive sleep apnea (adult) (pediatric): Secondary | ICD-10-CM | POA: Diagnosis not present

## 2018-06-02 DIAGNOSIS — G4733 Obstructive sleep apnea (adult) (pediatric): Secondary | ICD-10-CM | POA: Diagnosis not present

## 2018-10-19 IMAGING — CR DG CERVICAL SPINE 2 OR 3 VIEWS
3 series · 3 of 3 positions shown · non-contrast
Comparison: None.

CLINICAL DATA: Right-sided neck pain

EXAM:
CERVICAL SPINE - 2-3 VIEW

[w c-spine lat]
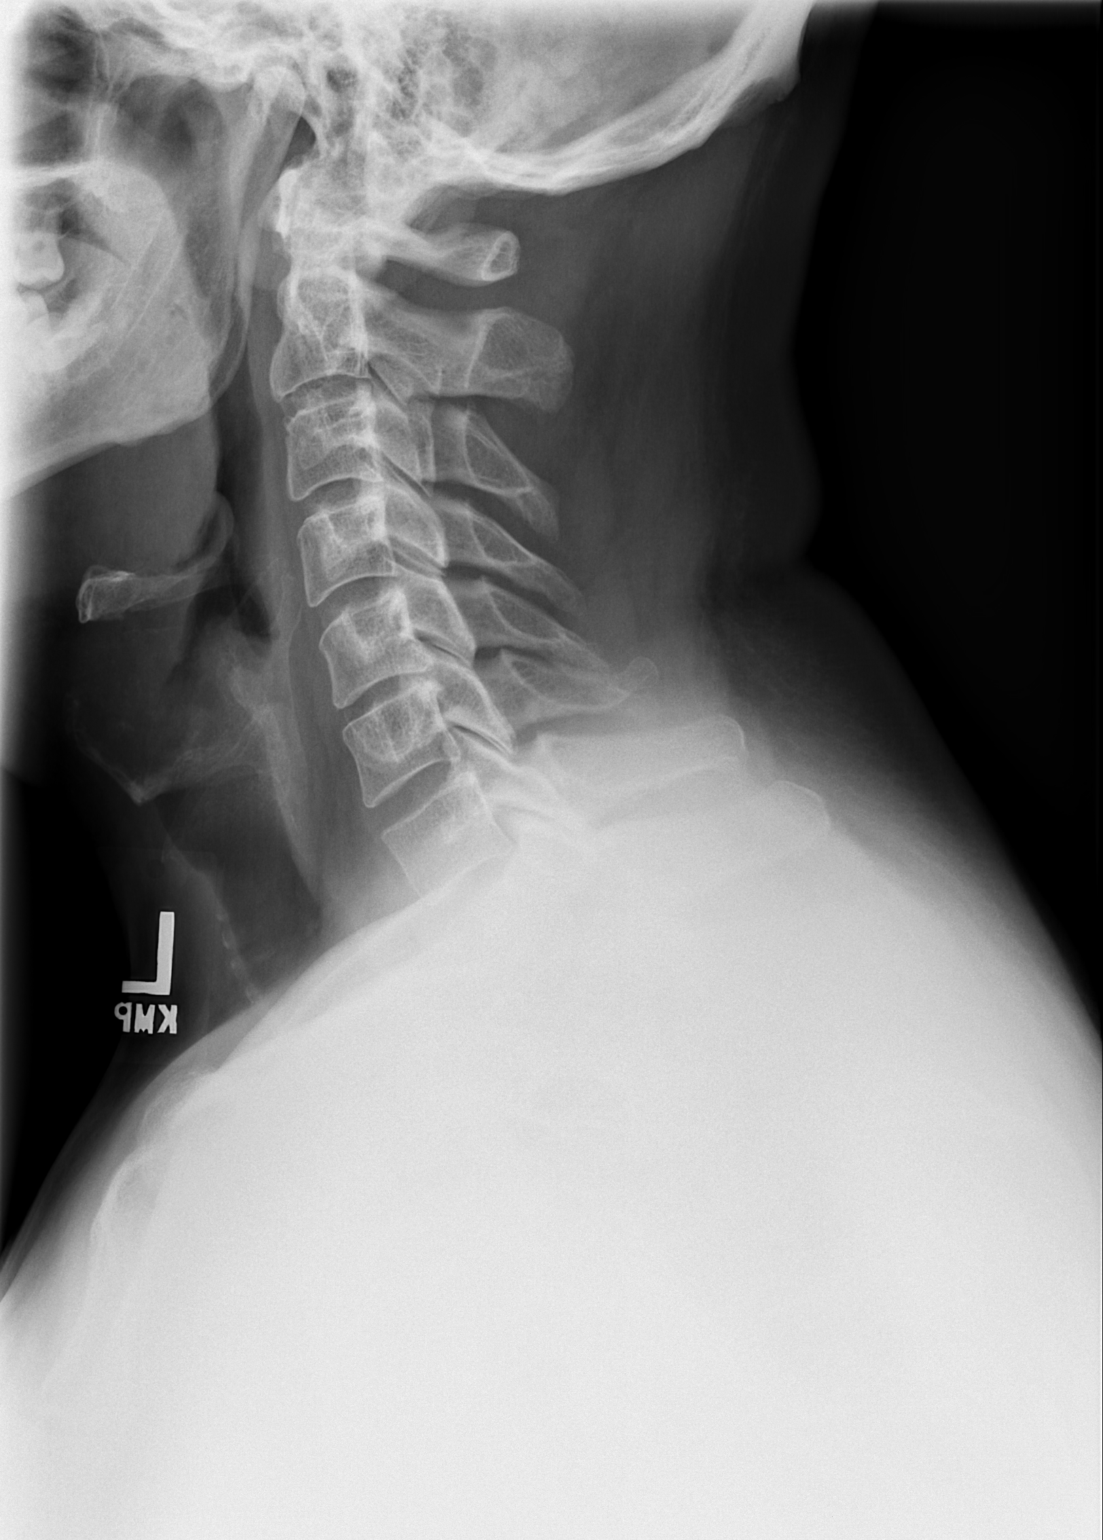

[w c-spine a.p. *]
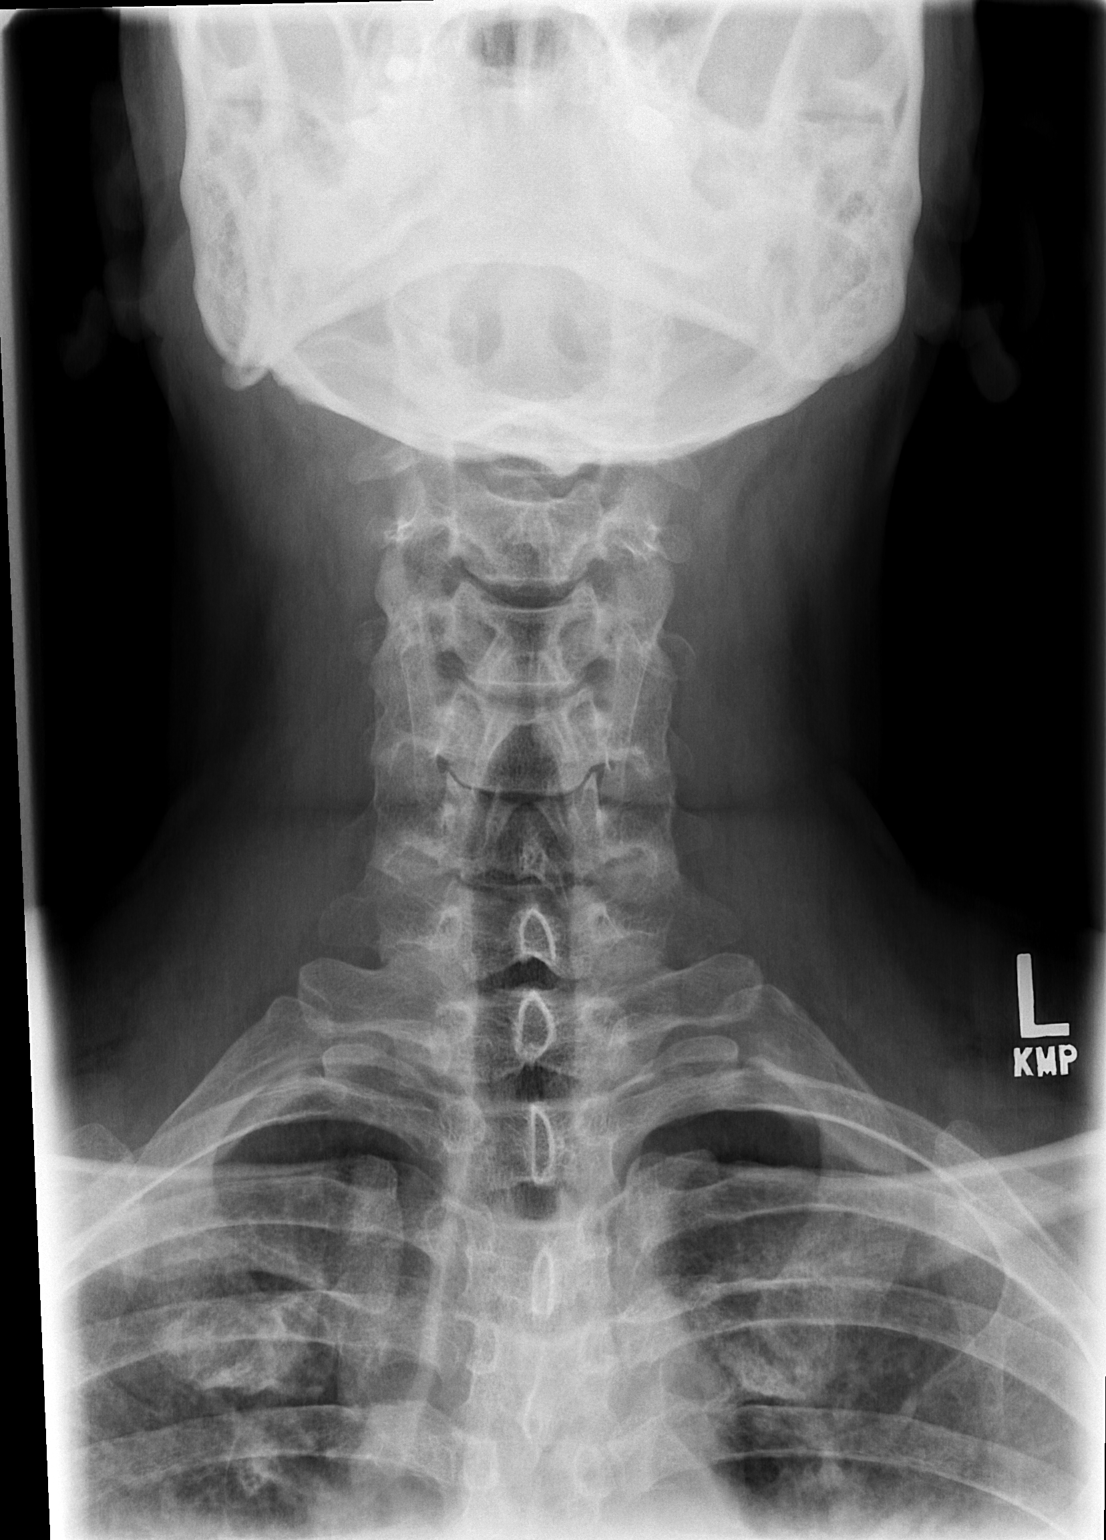

[w c-spine odontoid *]
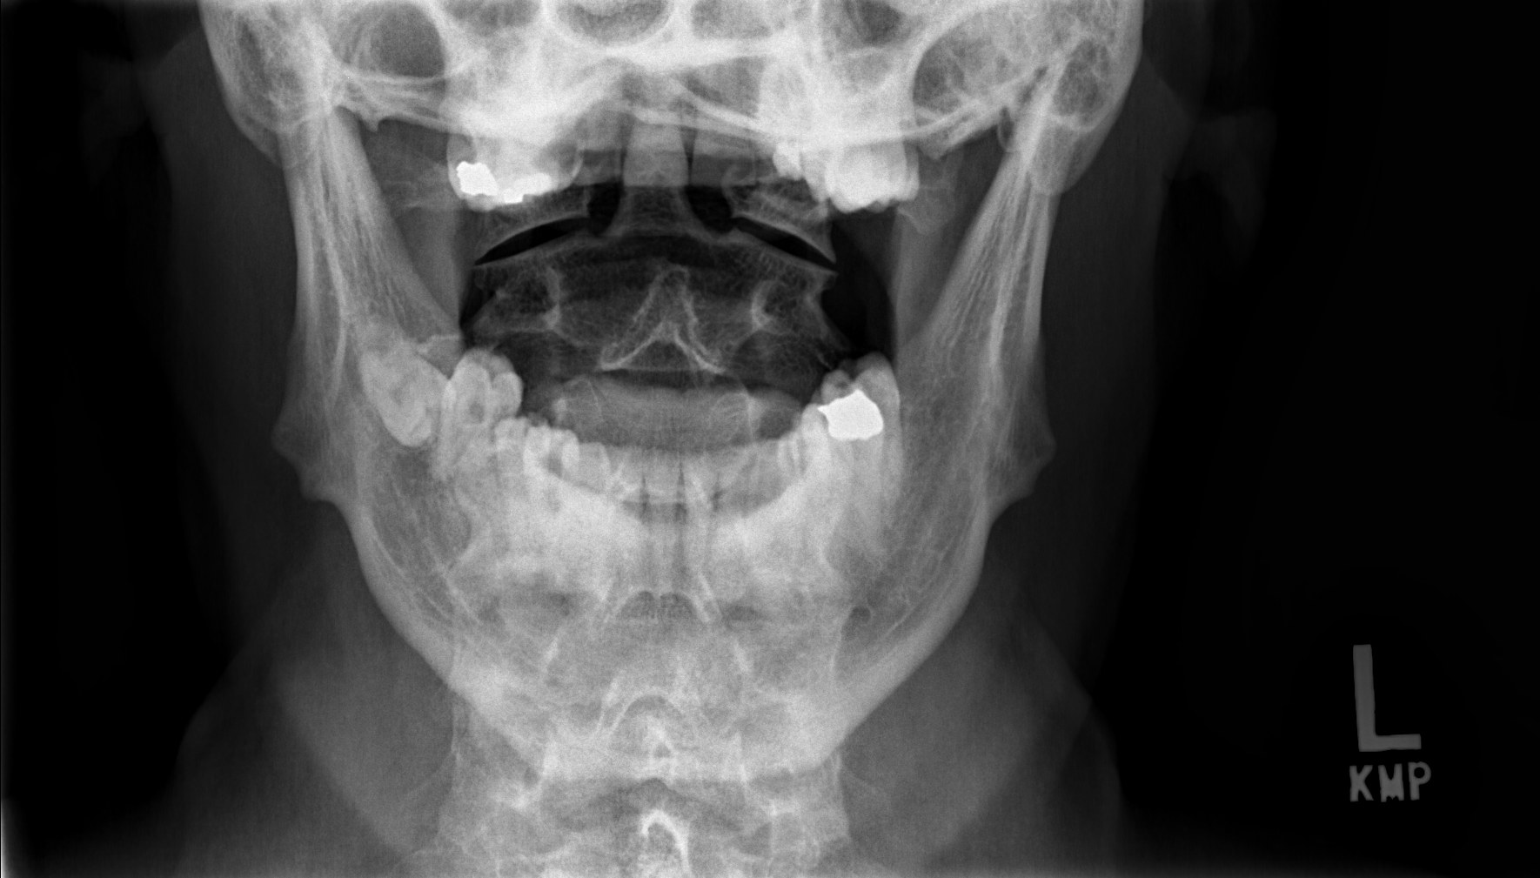

[3 of 3 positions shown; findings below may reference images not displayed]

FINDINGS: There is no evidence of cervical spine fracture or prevertebral soft
tissue swelling. Alignment is normal. No other significant bone
abnormalities are identified.
IMPRESSION: Negative cervical spine radiographs.
